# Patient Record
Sex: Female | Born: 1975
Health system: Southern US, Community
[De-identification: ages and names within clinical notes are randomized; demographics above are authoritative.]

## PROBLEM LIST (undated history)

## (undated) ENCOUNTER — Inpatient Hospital Stay (HOSPITAL_COMMUNITY): Payer: Self-pay

## (undated) DIAGNOSIS — R112 Nausea with vomiting, unspecified: Secondary | ICD-10-CM

## (undated) DIAGNOSIS — F32A Depression, unspecified: Secondary | ICD-10-CM

## (undated) DIAGNOSIS — Z9889 Other specified postprocedural states: Secondary | ICD-10-CM

## (undated) DIAGNOSIS — F329 Major depressive disorder, single episode, unspecified: Secondary | ICD-10-CM

## (undated) DIAGNOSIS — Z8489 Family history of other specified conditions: Secondary | ICD-10-CM

## (undated) DIAGNOSIS — Z789 Other specified health status: Secondary | ICD-10-CM

## (undated) HISTORY — PX: ECTOPIC PREGNANCY SURGERY: SHX613

## (undated) HISTORY — PX: DILATION AND CURETTAGE OF UTERUS: SHX78

## (undated) HISTORY — PX: BREAST ENHANCEMENT SURGERY: SHX7

## (undated) HISTORY — PX: OTHER SURGICAL HISTORY: SHX169

---

## 2003-11-03 ENCOUNTER — Other Ambulatory Visit: Admission: RE | Admit: 2003-11-03 | Discharge: 2003-11-03 | Payer: Self-pay | Admitting: Obstetrics and Gynecology

## 2004-11-19 ENCOUNTER — Other Ambulatory Visit: Admission: RE | Admit: 2004-11-19 | Discharge: 2004-11-19 | Payer: Self-pay | Admitting: Obstetrics and Gynecology

## 2006-01-27 ENCOUNTER — Other Ambulatory Visit: Admission: RE | Admit: 2006-01-27 | Discharge: 2006-01-27 | Payer: Self-pay | Admitting: Obstetrics and Gynecology

## 2007-04-29 ENCOUNTER — Emergency Department (HOSPITAL_COMMUNITY): Admission: EM | Admit: 2007-04-29 | Discharge: 2007-04-29 | Payer: Self-pay | Admitting: Family Medicine

## 2007-05-01 ENCOUNTER — Emergency Department (HOSPITAL_COMMUNITY): Admission: EM | Admit: 2007-05-01 | Discharge: 2007-05-01 | Payer: Self-pay | Admitting: Family Medicine

## 2007-05-04 ENCOUNTER — Ambulatory Visit (HOSPITAL_COMMUNITY): Admission: RE | Admit: 2007-05-04 | Discharge: 2007-05-04 | Payer: Self-pay | Admitting: Family Medicine

## 2007-05-06 ENCOUNTER — Other Ambulatory Visit: Admission: RE | Admit: 2007-05-06 | Discharge: 2007-05-06 | Payer: Self-pay | Admitting: Otolaryngology

## 2007-05-18 ENCOUNTER — Ambulatory Visit (HOSPITAL_COMMUNITY): Admission: RE | Admit: 2007-05-18 | Discharge: 2007-05-18 | Payer: Self-pay | Admitting: Orthopedic Surgery

## 2007-05-22 ENCOUNTER — Ambulatory Visit (HOSPITAL_COMMUNITY): Admission: RE | Admit: 2007-05-22 | Discharge: 2007-05-22 | Payer: Self-pay | Admitting: Orthopedic Surgery

## 2007-08-11 ENCOUNTER — Ambulatory Visit (HOSPITAL_COMMUNITY): Admission: RE | Admit: 2007-08-11 | Discharge: 2007-08-11 | Payer: Self-pay | Admitting: Otolaryngology

## 2008-01-03 ENCOUNTER — Emergency Department (HOSPITAL_COMMUNITY): Admission: EM | Admit: 2008-01-03 | Discharge: 2008-01-03 | Payer: Self-pay | Admitting: Emergency Medicine

## 2009-02-22 ENCOUNTER — Ambulatory Visit (HOSPITAL_COMMUNITY): Admission: RE | Admit: 2009-02-22 | Discharge: 2009-02-22 | Payer: Self-pay | Admitting: Otolaryngology

## 2009-11-15 ENCOUNTER — Observation Stay (HOSPITAL_COMMUNITY): Admission: AD | Admit: 2009-11-15 | Discharge: 2009-11-17 | Payer: Self-pay | Admitting: Obstetrics and Gynecology

## 2009-11-25 ENCOUNTER — Inpatient Hospital Stay (HOSPITAL_COMMUNITY): Admission: AD | Admit: 2009-11-25 | Discharge: 2009-12-13 | Payer: Self-pay | Admitting: Obstetrics & Gynecology

## 2010-01-12 DEATH — deceased

## 2010-10-16 ENCOUNTER — Emergency Department (HOSPITAL_COMMUNITY)
Admission: EM | Admit: 2010-10-16 | Discharge: 2010-10-16 | Payer: Self-pay | Source: Home / Self Care | Admitting: Family Medicine

## 2010-12-24 LAB — POCT URINALYSIS DIPSTICK
Glucose, UA: NEGATIVE mg/dL
Specific Gravity, Urine: >=1.03 (ref 1.005–1.030)
pH: 5.5 (ref 5.0–8.0)

## 2010-12-24 LAB — POCT PREGNANCY, URINE: Preg Test, Ur: NEGATIVE

## 2011-01-03 LAB — CBC
HCT: 32.9 % — ABNORMAL LOW (ref 36.0–46.0)
HCT: 34.1 % — ABNORMAL LOW (ref 36.0–46.0)
HCT: 34.7 % — ABNORMAL LOW (ref 36.0–46.0)
Hemoglobin: 11.4 g/dL — ABNORMAL LOW (ref 12.0–15.0)
Hemoglobin: 11.9 g/dL — ABNORMAL LOW (ref 12.0–15.0)
MCHC: 34.3 g/dL (ref 30.0–36.0)
MCHC: 34.4 g/dL (ref 30.0–36.0)
MCV: 89.6 fL (ref 78.0–100.0)
Platelets: 233 K/uL (ref 150–400)
RBC: 3.83 MIL/uL — ABNORMAL LOW (ref 3.87–5.11)
RBC: 3.88 MIL/uL (ref 3.87–5.11)
RDW: 12.8 % (ref 11.5–15.5)
RDW: 13.4 % (ref 11.5–15.5)
WBC: 13.2 K/uL — ABNORMAL HIGH (ref 4.0–10.5)

## 2011-01-03 LAB — STREP B DNA PROBE: Strep Group B Ag: NEGATIVE

## 2011-01-03 LAB — MRSA PCR SCREENING: MRSA by PCR: NEGATIVE

## 2011-01-03 LAB — COMPREHENSIVE METABOLIC PANEL
Alkaline Phosphatase: 102 U/L (ref 39–117)
BUN: 3 mg/dL — ABNORMAL LOW (ref 6–23)
Calcium: 9.1 mg/dL (ref 8.4–10.5)
Chloride: 108 mEq/L (ref 96–112)
GFR calc non Af Amer: >60 mL/min (ref >60)
Glucose, Bld: 88 mg/dL (ref 70–99)
Potassium: 3.5 mEq/L (ref 3.5–5.1)
Total Bilirubin: 0.2 mg/dL — ABNORMAL LOW (ref 0.3–1.2)

## 2011-01-07 LAB — CBC
HCT: 23.2 % — ABNORMAL LOW (ref 36.0–46.0)
Hemoglobin: 7.9 g/dL — ABNORMAL LOW (ref 12.0–15.0)
Hemoglobin: 8.6 g/dL — ABNORMAL LOW (ref 12.0–15.0)
MCHC: 34.9 g/dL (ref 30.0–36.0)
MCV: 89.4 fL (ref 78.0–100.0)
MCV: 89.8 fL (ref 78.0–100.0)
Platelets: 139 10*3/uL — ABNORMAL LOW (ref 150–400)
RBC: 2.76 MIL/uL — ABNORMAL LOW (ref 3.87–5.11)
WBC: 18.3 10*3/uL — ABNORMAL HIGH (ref 4.0–10.5)
WBC: 8.4 10*3/uL (ref 4.0–10.5)

## 2011-07-08 LAB — I-STAT 8, (EC8 V) (CONVERTED LAB)
Acid-base deficit: 3 — ABNORMAL HIGH
Hemoglobin: 14.3
Potassium: 3.7
Sodium: 139
TCO2: 26

## 2011-07-08 LAB — URINALYSIS, ROUTINE W REFLEX MICROSCOPIC
Bilirubin Urine: NEGATIVE
Hgb urine dipstick: NEGATIVE
Ketones, ur: NEGATIVE
Nitrite: NEGATIVE
Urobilinogen, UA: 0.2
pH: 7

## 2011-07-08 LAB — POCT I-STAT CREATININE
Creatinine, Ser: 0.8
Operator id: 284141

## 2011-07-29 LAB — CBC
HCT: 39.7
Hemoglobin: 13.6
MCHC: 34.3
MCV: 89.1
Platelets: 207
RBC: 4.46
RDW: 12.7
WBC: 7.7

## 2011-07-29 LAB — T4, FREE: Free T4: 0.84 — ABNORMAL LOW

## 2011-07-29 LAB — COMPREHENSIVE METABOLIC PANEL
ALT: 18
AST: 17
Albumin: 4.7
Alkaline Phosphatase: 62
BUN: 8
CO2: 24
Calcium: 9.3
Chloride: 102
Creatinine, Ser: 0.69
GFR calc Af Amer: >60
GFR calc non Af Amer: >60
Glucose, Bld: 96
Potassium: 3.6
Sodium: 132 — ABNORMAL LOW
Total Bilirubin: 0.6
Total Protein: 7.7

## 2011-07-29 LAB — TSH: TSH: 1.208

## 2011-12-05 ENCOUNTER — Other Ambulatory Visit (HOSPITAL_COMMUNITY): Payer: Self-pay | Admitting: Internal Medicine

## 2011-12-05 DIAGNOSIS — E041 Nontoxic single thyroid nodule: Secondary | ICD-10-CM

## 2011-12-11 ENCOUNTER — Ambulatory Visit (HOSPITAL_COMMUNITY)
Admission: RE | Admit: 2011-12-11 | Discharge: 2011-12-11 | Disposition: A | Discharge status: Home / Self Care | Payer: 59 | Source: Ambulatory Visit | Attending: Internal Medicine | Admitting: Internal Medicine

## 2011-12-11 DIAGNOSIS — E041 Nontoxic single thyroid nodule: Secondary | ICD-10-CM | POA: Insufficient documentation

## 2011-12-13 ENCOUNTER — Other Ambulatory Visit: Payer: Self-pay | Admitting: Internal Medicine

## 2011-12-13 DIAGNOSIS — E041 Nontoxic single thyroid nodule: Secondary | ICD-10-CM

## 2011-12-19 ENCOUNTER — Ambulatory Visit
Admission: RE | Admit: 2011-12-19 | Discharge: 2011-12-19 | Disposition: A | Discharge status: Home / Self Care | Payer: 59 | Source: Ambulatory Visit | Attending: Internal Medicine | Admitting: Internal Medicine

## 2011-12-19 ENCOUNTER — Other Ambulatory Visit (HOSPITAL_COMMUNITY)
Admission: RE | Admit: 2011-12-19 | Discharge: 2011-12-19 | Disposition: A | Discharge status: Home / Self Care | Payer: 59 | Source: Ambulatory Visit | Attending: Interventional Radiology | Admitting: Interventional Radiology

## 2011-12-19 DIAGNOSIS — E041 Nontoxic single thyroid nodule: Secondary | ICD-10-CM

## 2011-12-19 DIAGNOSIS — E049 Nontoxic goiter, unspecified: Secondary | ICD-10-CM | POA: Insufficient documentation

## 2012-02-16 ENCOUNTER — Inpatient Hospital Stay (HOSPITAL_COMMUNITY): Payer: 59

## 2012-02-16 ENCOUNTER — Encounter (HOSPITAL_COMMUNITY): Payer: Self-pay

## 2012-02-16 ENCOUNTER — Inpatient Hospital Stay (HOSPITAL_COMMUNITY)
Admission: AD | Admit: 2012-02-16 | Discharge: 2012-02-16 | Disposition: A | Discharge status: Home / Self Care | Payer: 59 | Source: Ambulatory Visit | Attending: Obstetrics and Gynecology | Admitting: Obstetrics and Gynecology

## 2012-02-16 DIAGNOSIS — O00109 Unspecified tubal pregnancy without intrauterine pregnancy: Secondary | ICD-10-CM | POA: Insufficient documentation

## 2012-02-16 HISTORY — DX: Depression, unspecified: F32.A

## 2012-02-16 HISTORY — DX: Major depressive disorder, single episode, unspecified: F32.9

## 2012-02-16 LAB — URINE MICROSCOPIC-ADD ON

## 2012-02-16 LAB — DIFFERENTIAL
Basophils Absolute: 0 10*3/uL (ref 0.0–0.1)
Lymphocytes Relative: 17 % (ref 12–46)
Monocytes Absolute: 0.8 10*3/uL (ref 0.1–1.0)
Monocytes Relative: 7 % (ref 3–12)
Neutro Abs: 8.3 10*3/uL — ABNORMAL HIGH (ref 1.7–7.7)

## 2012-02-16 LAB — URINALYSIS, ROUTINE W REFLEX MICROSCOPIC
Glucose, UA: NEGATIVE mg/dL
Ketones, ur: NEGATIVE mg/dL
Leukocytes, UA: NEGATIVE
Nitrite: NEGATIVE
Protein, ur: NEGATIVE mg/dL
Urobilinogen, UA: 0.2 mg/dL (ref 0.0–1.0)

## 2012-02-16 LAB — CBC
HCT: 37.7 % (ref 36.0–46.0)
Hemoglobin: 12.5 g/dL (ref 12.0–15.0)
MCH: 29.4 pg (ref 26.0–34.0)
MCHC: 33.2 g/dL (ref 30.0–36.0)
MCV: 88.7 fL (ref 78.0–100.0)
Platelets: 227 10*3/uL (ref 150–400)
RBC: 4.25 MIL/uL (ref 3.87–5.11)
RDW: 12.9 % (ref 11.5–15.5)
WBC: 11.1 10*3/uL — ABNORMAL HIGH (ref 4.0–10.5)

## 2012-02-16 LAB — ABO/RH: ABO/RH(D): B POS

## 2012-02-16 LAB — CREATININE, SERUM: GFR calc non Af Amer: >90 mL/min (ref >90)

## 2012-02-16 LAB — HCG, QUANTITATIVE, PREGNANCY: hCG, Beta Chain, Quant, S: 1168 m[IU]/mL — ABNORMAL HIGH (ref <5)

## 2012-02-16 MED ORDER — METHOTREXATE INJECTION FOR WOMEN'S HOSPITAL
50.0000 mg/m2 | Freq: Once | INTRAMUSCULAR | Status: AC
  Administered 2012-02-16: 105 mg via INTRAMUSCULAR
  Filled 2012-02-16: qty 2.1

## 2012-02-16 NOTE — Discharge Instructions (Signed)
Ectopic Pregnancy An ectopic pregnancy is when the fertilized egg attaches (implants) outside the uterus. Most ectopic pregnancies occur in the fallopian tube. Rarely do ectopic pregnancies occur on the ovary, intestine, pelvis, or cervix. An ectopic pregnancy does not have the ability to develop into a normal, healthy baby.  A ruptured ectopic pregnancy is one in which the fallopian tube gets torn or bursts and results in internal bleeding. Often there is intense abdominal pain, and sometimes, vaginal bleeding. Having an ectopic pregnancy can be a life-threatening experience. If left untreated, this dangerous condition can lead to a blood transfusion, abdominal surgery, or even death. CAUSES  Damage to the fallopian tubes is the suspected cause in most ectopic pregnancies.  RISK FACTORS Depending on your circumstances, the amount of risk of having an ectopic pregnancy will vary. There are 3 categories that may help you identify whether you are potentially at risk. High Risk  You have gone through infertility treatment.   You have had a previous ectopic pregnancy.   You have had previous tubal surgery.   You have had previous surgery to have the fallopian tubes tied (tubal ligation).   You have tubal problems or diseases.   You have been exposed to DES. DES is a medicine that was used until 1971 and had effects on babies whose mothers took the medicine.   You become pregnant while using an intrauterine device (IUD) for birth control.  Moderate Risk  You have a history of infertility.   You have a history of a sexually transmitted infection (STI).   You have a history of pelvic inflammatory disease (PID).   You have scarring from endometriosis.   You have multiple sexual partners.   You smoke.  Low Risk  You have had previous pelvic surgery.   You use vaginal douching.   You became sexually active before 34 years of age.  SYMPTOMS  An ectopic pregnancy should be  suspected in anyone who has missed a period and has abdominal pain or bleeding.  You may experience normal pregnancy symptoms, such as:   Nausea.   Tiredness.   Breast tenderness.   Symptoms that are not normal include:   Pain with intercourse.   Irregular vaginal bleeding or spotting.   Cramping or pain on one side, or in the lower abdomen.   Fast heartbeat.   Passing out while having a bowel movement.   Symptoms of a ruptured ectopic pregnancy and internal bleeding may include:   Sudden, severe pain in the abdomen and pelvis.   Dizziness or fainting.   Pain in the shoulder area.  DIAGNOSIS  Tests that may be performed include:  A pregnancy test.   An ultrasound.   Testing the specific level of pregnancy hormone in the bloodstream.   Taking a sample of uterus tissue (dilation and curettage, D&C).   Surgery to perform a visual exam of the inside of the abdomen using a lighted tube (laparoscopy).  TREATMENT  An injection of methotrexate medicine may be given. This is given if:  The diagnosis is made early.   The fallopian tube has not ruptured.   You are considered to be a good candidate for the medicine.  Usually, pregnancy hormone blood levels are checked after methotrexate treatment. This is to be sure the medicine is effective. It may take 4 to 6 weeks for the pregnancy to be absorbed (though most pregnancies will be absorbed by 3 weeks). Surgical treatment may be needed. A lighted tube (  laparoscope) is used to remove the tubal pregnancy. If severe internal bleeding occurs, a cut (incision) may be made in the lower abdomen (laparotomy), and the ectopic pregnancy is removed. This stops the bleeding. Part of the fallopian tube, or the whole tube, may be removed as well (salpingectomy). After surgery, pregnancy hormone tests may be done to be sure there is no pregnancy tissue left. You may receive a RhoGAM shot if you are Rh negative and the father is Rh positive, or  if you do not know the Rh type of the father. This is to prevent problems with any future pregnancy. SEEK IMMEDIATE MEDICAL CARE IF:  You have any symptoms of an ectopic pregnancy. This is a medical emergency.   Return to Maternity Admission on on Wednesday 02/19/12 for repeat pregnancy hormone.   Document Released: 11/07/2004 Document Revised: 09/19/2011 Document Reviewed: 06/06/2011 [employer] Patient Information 2012 [employer], LLCMethotrexate Treatment for an Ectopic Pregnancy An ectopic pregnancy is when the fertilized egg attaches (implants) outside the uterus. Most ectopic pregnancies occur in the fallopian tube. Rarely do ectopic pregnancies occur on the ovary, intestine, pelvis, or cervix. An ectopic pregnancy does not have the ability to develop into a normal, healthy baby. Having an ectopic pregnancy can be a life-threatening experience. However, if the ectopic pregnancy is found early enough, it can be treated with a medicine. This medicine is called methotrexate. Methotrexate works by stopping the pregnancy from growing. It helps the body absorb the pregnancy tissue over a 2 to 6 week period (though most pregnancies will be absorbed by 3 weeks).  If methotrexate is successful, there is a good chance that the fallopian tube may be saved. Regardless of whether the fallopian tube is saved, a mother who has had an ectopic pregnancy is at a much higher risk of having another ectopic occur in future pregnancies. One serious concern is the potential for the fallopian tube to tear (rupture). If it does, emergency surgery is needed to remove the pregnancy, and methotrexate cannot be used. The ideal patient for methotrexate is a person who is:   Not bleeding internally.   Has no severe or persistent abdominal pain.   Is committed to following through with lab tests and appointments until the ectopic has absorbed.   Is healthy and has normal liver and kidney functions on evaluation.  Methotrexate  should not be given to women who:  Are breastfeeding.   Have a normal pregnancy (intrauterine pregnancy).   Have liver, lung, or kidney disease.   Have blood problems.   Are allergic to methotrexate.   Have peptic ulcers.   Have an ectopic pregnancy larger than 1 inches (3.5 cm) or one that has fetal heartbeats. This is a rule that is followed most of the time (relative contraindication).  BEFORE THE TREATMENT Before giving the medicine:  Liver tests, kidney tests, and a complete blood test are performed.   Blood tests are performed to measure the pregnancy hormone levels and to determine the mother's blood type.   If the woman is Rh negative, and the father is Rh positive or his Rh type is not known, a RhoGAM shot is given.  TREATMENT  There are 2 methods that your caregiver may use to prescribe methotrexate. One method involves a single dose or injection of the medicine. Another method involves a series of doses. This method involves several injections.  AFTER THE TREATMENT Blood tests will be taken for several weeks to check the pregnancy hormone levels. The blood tests  are performed until there is no more pregnancy hormone detected in the blood. There is still a risk of the ectopic pregnancy rupturing while using the methotrexate. There are also side effects of methotrexate, which include:   Nausea and vomiting.   Mouth sores.   Diarrhea.   Rash.   Dizziness.   Increased abdominal pain.   Increased vaginal bleeding or spotting.   Pneumonia.   Failed treatment.   Hair loss. This is rare and reversible.  On very rare occasions, the medicine may affect your blood counts, liver, kidney, bone marrow, or hormone levels. If this happens, your caregiver will want to perform further evaluations. Document Released: 09/24/2001 Document Revised: 09/19/2011 Document Reviewed: 06/06/2011 [facility] Patient Information 2012 [place], [place].Marland Kitchen

## 2012-02-16 NOTE — MAU Note (Signed)
Patient was given handout on ectopic pregnancy and Methotrexate.

## 2012-02-16 NOTE — MAU Note (Signed)
Dr. McComb in to see patient. 

## 2012-02-16 NOTE — MAU Provider Note (Signed)
History     CSN: [id number]  Arrival date & time 02/16/12  1407   None     Chief Complaint  Patient presents with  . Vaginal Bleeding  . Abdominal Cramping   HPI Catherine Fuller is a 36 y.o. female who presents to MAU for vaginal bleeding in early pregnancy. The bleeding started approximately 2 hours prior to arrival to MAU. Low abdominal cramping and pain in right side earlier but less now. History of tubal reversal 2010. Has been pregnant since then and lost a pregnancy @ 19 weeks due to incompetent cervix. Then a a miscarriage at [redacted] weeks gestation. The history was provided by the patient.  Past Medical History  Diagnosis Date  . Depression     Past Surgical History  Procedure Date  . Tubal reversal   . Breast enhancement surgery   . Cesarean section   . Dilation and curettage of uterus     No family history on file.  History  Substance Use Topics  . Smoking status: Never Smoker   . Smokeless tobacco: Not on file  . Alcohol Use: No    OB History    Grav Para Term Preterm Abortions TAB SAB Ect Mult Living   5 2   2  2   2       Review of Systems  Constitutional: Negative for fever, chills, diaphoresis and fatigue.  HENT: Negative for ear pain, congestion, sore throat, facial swelling, neck pain, neck stiffness, dental problem and sinus pressure.   Eyes: Negative for photophobia, pain and discharge.  Respiratory: Negative for cough, chest tightness and wheezing.   Cardiovascular: Negative for chest pain, palpitations and leg swelling.  Gastrointestinal: Positive for nausea and abdominal pain. Negative for vomiting, diarrhea, constipation and abdominal distention.  Genitourinary: Positive for frequency, vaginal bleeding and pelvic pain. Negative for dysuria, flank pain, vaginal discharge and difficulty urinating.  Musculoskeletal: Positive for back pain. Negative for myalgias and gait problem.  Skin: Negative for color change and rash.  Neurological: Positive for  headaches. Negative for dizziness, speech difficulty, weakness, light-headedness and numbness.  Psychiatric/Behavioral: Negative for confusion and agitation. Nervous/anxious: history of situational depression.     Allergies  Percocet  Home Medications  No current outpatient prescriptions on file.  BP 122/72  Pulse 85  Temp(Src) 98.9 F (37.2 C) (Oral)  Resp 16  Ht 5\' 6"  (1.676 m)  Wt 208 lb (94.348 kg)  BMI 33.57 kg/m2  LMP 01/06/2012  Physical Exam  Nursing note and vitals reviewed. Constitutional: She is oriented to person, place, and time. She appears well-developed and well-nourished. No distress.  HENT:  Head: Normocephalic.  Eyes: EOM are normal.  Neck: Neck supple.  Pulmonary/Chest: Effort normal.  Abdominal: Soft. There is no tenderness.  Genitourinary:       External genitalia without lesions. Small blood vaginal vault. Cervix closed, long, no CMT, no adnexal tenderness  Musculoskeletal: Normal range of motion.  Neurological: She is alert and oriented to person, place, and time. No cranial nerve deficit.  Skin: Skin is warm and dry.  Psychiatric: Her behavior is normal. Judgment and thought content normal. Her mood appears anxious.   Results for orders placed during the hospital encounter of 02/16/12 (from the past 24 hour(s))  URINALYSIS, ROUTINE W REFLEX MICROSCOPIC     Status: Abnormal   Collection Time   02/16/12  2:14 PM      Component Value Range   Color, Urine YELLOW  YELLOW    APPearance  CLEAR  CLEAR    Specific Gravity, Urine 1.015  1.005 - 1.030    pH 6.5  5.0 - 8.0    Glucose, UA NEGATIVE  NEGATIVE (mg/dL)   Hgb urine dipstick MODERATE (*) NEGATIVE    Bilirubin Urine NEGATIVE  NEGATIVE    Ketones, ur NEGATIVE  NEGATIVE (mg/dL)   Protein, ur NEGATIVE  NEGATIVE (mg/dL)   Urobilinogen, UA 0.2  0.0 - 1.0 (mg/dL)   Nitrite NEGATIVE  NEGATIVE    Leukocytes, UA NEGATIVE  NEGATIVE   URINE MICROSCOPIC-ADD ON     Status: Normal   Collection Time   02/16/12   2:14 PM      Component Value Range   WBC, UA 0-2  <3 (WBC/hpf)   RBC / HPF 0-2  <3 (RBC/hpf)   Bacteria, UA RARE  RARE   HCG, QUANTITATIVE, PREGNANCY     Status: Abnormal   Collection Time   02/16/12  2:40 PM      Component Value Range   hCG, Beta Chain, Quant, S 1168 (*) <5 (mIU/mL)  ABO/RH     Status: Normal   Collection Time   02/16/12  2:41 PM      Component Value Range   ABO/RH(D) B POS    CBC     Status: Abnormal   Collection Time   02/16/12  2:41 PM      Component Value Range   WBC 11.1 (*) 4.0 - 10.5 (K/uL)   RBC 4.25  3.87 - 5.11 (MIL/uL)   Hemoglobin 12.5  12.0 - 15.0 (g/dL)   HCT 16.1  09.6 - 04.5 (%)   MCV 88.7  78.0 - 100.0 (fL)   MCH 29.4  26.0 - 34.0 (pg)   MCHC 33.2  30.0 - 36.0 (g/dL)   RDW 40.9  81.1 - 91.4 (%)   Platelets 227  150 - 400 (K/uL)    ED Course  Procedures Ultrasound today shows no IUP normal ovaries, right adnexal mass measuring 5.1 x 2.1 . 1.7 cm  6 mm MSD right adnexal ectopic GS without YS or fetal pole. Small FF.   MDM: consult with Dr. Arelia Sneddon and he will be in to evaluate the patient and discuss treatment options.

## 2012-02-16 NOTE — MAU Provider Note (Signed)
  History     CSN: [id number]  Arrival date and time: 02/16/12 1407   First Provider Initiated Contact with Patient 02/16/12 1445      Chief Complaint  Patient presents with  . Vaginal Bleeding  . Abdominal Cramping   HPI    Past Medical History  Diagnosis Date  . Depression     Past Surgical History  Procedure Date  . Tubal reversal   . Breast enhancement surgery   . Cesarean section   . Dilation and curettage of uterus     No family history on file.  History  Substance Use Topics  . Smoking status: Never Smoker   . Smokeless tobacco: Not on file  . Alcohol Use: No    Allergies:  Allergies  Allergen Reactions  . Percocet (Oxycodone-Acetaminophen) Nausea And Vomiting    Prescriptions prior to admission  Medication Sig Dispense Refill  . clonazePAM (KLONOPIN) 1 MG tablet Take 1 mg by mouth 2 (two) times daily as needed. For anxiety      . FLUoxetine (PROZAC) 20 MG capsule Take 20 mg by mouth at bedtime.      . Prenatal Vit-Fe Fumarate-FA (PRENATAL MULTIVITAMIN) TABS Take 1 tablet by mouth at bedtime.        ROS Physical Exam   Blood pressure 122/72, pulse 85, temperature 98.9 F (37.2 C), temperature source Oral, resp. rate 16, height 5\' 6"  (1.676 m), weight 94.348 kg (208 lb), last menstrual period 01/06/2012.  Physical Exam  MAU Course  Procedures  MDM sono c/w right ectopic   Assessment and Plan  Highly probably ectopic.   Discussed finding.  Options include following vs methotraxate or surgery.  Can not be 100 % that it is a ectopic.  If intrauterine mtx would lead to birth defects and require termination.  80 % chance mtx would be successful. Possible need for surgery or repeat injection.  Patient desires mtx. Will check labs and arrange f/u.  Signs of rupture are given.  If increase pain report to MAU.   Nadiah Corbit S 02/16/2012, 5:04 PM

## 2012-02-16 NOTE — MAU Note (Signed)
Patient presents with vaginal bleeding and cramping on right side onset this morning and had a lot of blood in the toilet pain 6/10, patient had a loss at 19 1/2 weeks about 2 years ago and a miscarriage one year ago, LMP 01/06/12

## 2012-02-16 NOTE — MAU Note (Signed)
Patient was given information on ectopic pregnancy discussed loss with patient allowed her to discuss her feelings gave her resources on grief counseling available in the area.  Patient was told by Dr. Arelia Sneddon to follow up in the office on Wednesday for blood work patient will call MD office tomorrow for appointment reviewed ectopic precautions patient verbalized an understanding.

## 2012-03-13 ENCOUNTER — Ambulatory Visit: Payer: Self-pay | Admitting: Internal Medicine

## 2012-08-05 ENCOUNTER — Encounter (HOSPITAL_COMMUNITY): Payer: Self-pay | Admitting: *Deleted

## 2012-08-05 ENCOUNTER — Inpatient Hospital Stay (HOSPITAL_COMMUNITY)
Admission: AD | Admit: 2012-08-05 | Discharge: 2012-08-05 | Disposition: A | Discharge status: Home / Self Care | Payer: 59 | Source: Ambulatory Visit | Attending: Obstetrics & Gynecology | Admitting: Obstetrics & Gynecology

## 2012-08-05 DIAGNOSIS — O00109 Unspecified tubal pregnancy without intrauterine pregnancy: Secondary | ICD-10-CM | POA: Insufficient documentation

## 2012-08-05 LAB — CREATININE, SERUM
Creatinine, Ser: 0.59 mg/dL (ref 0.50–1.10)
GFR calc Af Amer: >90 mL/min (ref >90)
GFR calc non Af Amer: >90 mL/min (ref >90)

## 2012-08-05 LAB — CBC WITH DIFFERENTIAL/PLATELET
Eosinophils Absolute: 0 10*3/uL (ref 0.0–0.7)
Hemoglobin: 12.4 g/dL (ref 12.0–15.0)
Lymphs Abs: 1.8 10*3/uL (ref 0.7–4.0)
MCH: 30 pg (ref 26.0–34.0)
Monocytes Relative: 7 % (ref 3–12)
Neutrophils Relative %: 77 % (ref 43–77)
RBC: 4.14 MIL/uL (ref 3.87–5.11)

## 2012-08-05 LAB — BUN: BUN: 10 mg/dL (ref 6–23)

## 2012-08-05 LAB — TYPE AND SCREEN: ABO/RH(D): B POS

## 2012-08-05 MED ORDER — RHO D IMMUNE GLOBULIN 1500 UNIT/2ML IJ SOLN
300.0000 ug | Freq: Once | INTRAMUSCULAR | Status: DC
  Filled 2012-08-05: qty 2

## 2012-08-05 MED ORDER — METHOTREXATE INJECTION FOR WOMEN'S HOSPITAL
50.0000 mg/m2 | Freq: Once | INTRAMUSCULAR | Status: AC
  Administered 2012-08-05: 100 mg via INTRAMUSCULAR
  Filled 2012-08-05: qty 2

## 2012-08-05 NOTE — H&P (Signed)
36 year old presents with left ectopic pregnancy. Denies pain or bleeding, Ultrasound today no IUP and 2.3 cm left adnexal mass. No free fluid Quant 2224 History of ectopic pregnancy successfully treated with methotrexate in April. History of tubal reversal and endometrial ablation.  Afebrile Vital signs stable General alert and oriented Lung CTAB Car RRR Abdomen is soft and non tender  IMPRESSION: Left ectopic pregnancy  PLAN: Methotrexate

## 2012-08-05 NOTE — MAU Note (Signed)
No adverse reaction to MTX 

## 2012-08-05 NOTE — MAU Note (Signed)
Pt denies pain or bleeding. Voices understanding to return to Dr. Lynnell Dike office for f/u bhcg level on Monday. Appt made already in office today.

## 2012-08-05 NOTE — MAU Note (Signed)
Pt sent from Dr. Lynnell Dike office for methotrexate.  Dx'd with ectopic pregnancy today.

## 2012-08-10 ENCOUNTER — Inpatient Hospital Stay (HOSPITAL_COMMUNITY)
Admission: AD | Admit: 2012-08-10 | Discharge: 2012-08-10 | Disposition: A | Discharge status: Home / Self Care | Payer: 59 | Source: Ambulatory Visit | Attending: Obstetrics and Gynecology | Admitting: Obstetrics and Gynecology

## 2012-08-10 DIAGNOSIS — O00109 Unspecified tubal pregnancy without intrauterine pregnancy: Secondary | ICD-10-CM | POA: Insufficient documentation

## 2012-08-10 LAB — CBC WITH DIFFERENTIAL/PLATELET
Basophils Absolute: 0 10*3/uL (ref 0.0–0.1)
Basophils Relative: 0 % (ref 0–1)
Eosinophils Absolute: 0.1 10*3/uL (ref 0.0–0.7)
Eosinophils Relative: 2 % (ref 0–5)
MCH: 29.8 pg (ref 26.0–34.0)
MCHC: 33.9 g/dL (ref 30.0–36.0)
MCV: 87.8 fL (ref 78.0–100.0)
Platelets: 192 10*3/uL (ref 150–400)
RDW: 12.8 % (ref 11.5–15.5)
WBC: 6.6 10*3/uL (ref 4.0–10.5)

## 2012-08-10 LAB — COMPREHENSIVE METABOLIC PANEL
ALT: 12 U/L (ref 0–35)
AST: 14 U/L (ref 0–37)
Calcium: 9 mg/dL (ref 8.4–10.5)
Creatinine, Ser: 0.55 mg/dL (ref 0.50–1.10)
GFR calc Af Amer: >90 mL/min (ref >90)
Sodium: 136 mEq/L (ref 135–145)
Total Protein: 6.9 g/dL (ref 6.0–8.3)

## 2012-08-10 MED ORDER — METHOTREXATE INJECTION FOR WOMEN'S HOSPITAL
50.0000 mg/m2 | Freq: Once | INTRAMUSCULAR | Status: AC
  Administered 2012-08-10: 105 mg via INTRAMUSCULAR
  Filled 2012-08-10: qty 2.1

## 2012-08-10 NOTE — MAU Note (Signed)
Patient states she was seen in MAU on 10-23 and given MTX for an ectopic on the left side. Patient states she was called today and told the BHCG was over 9000 and told to return for a repeat injection. Patient denies any bleeding but does have some pain on the left side and has nausea all the time. Patient is concerned about a rupture of the ectopic.

## 2012-08-10 NOTE — MAU Note (Signed)
Methotrexate injection administered @ 1901 without any complications.  Pt left MAU 15 min later and will return to office this Thursday the 31st.

## 2012-08-26 ENCOUNTER — Other Ambulatory Visit: Payer: Self-pay | Admitting: Dermatology

## 2012-10-26 ENCOUNTER — Emergency Department (HOSPITAL_COMMUNITY)
Admission: EM | Admit: 2012-10-26 | Discharge: 2012-10-26 | Disposition: A | Discharge status: Home / Self Care | Payer: PRIVATE HEALTH INSURANCE | Attending: Emergency Medicine | Admitting: Emergency Medicine

## 2012-10-26 ENCOUNTER — Encounter (HOSPITAL_COMMUNITY): Payer: Self-pay | Admitting: *Deleted

## 2012-10-26 DIAGNOSIS — Y9389 Activity, other specified: Secondary | ICD-10-CM | POA: Insufficient documentation

## 2012-10-26 DIAGNOSIS — F329 Major depressive disorder, single episode, unspecified: Secondary | ICD-10-CM | POA: Insufficient documentation

## 2012-10-26 DIAGNOSIS — Z79899 Other long term (current) drug therapy: Secondary | ICD-10-CM | POA: Insufficient documentation

## 2012-10-26 DIAGNOSIS — Y9289 Other specified places as the place of occurrence of the external cause: Secondary | ICD-10-CM | POA: Insufficient documentation

## 2012-10-26 DIAGNOSIS — W1809XA Striking against other object with subsequent fall, initial encounter: Secondary | ICD-10-CM | POA: Insufficient documentation

## 2012-10-26 DIAGNOSIS — F3289 Other specified depressive episodes: Secondary | ICD-10-CM | POA: Insufficient documentation

## 2012-10-26 DIAGNOSIS — S60229A Contusion of unspecified hand, initial encounter: Secondary | ICD-10-CM | POA: Insufficient documentation

## 2012-10-26 DIAGNOSIS — T148XXA Other injury of unspecified body region, initial encounter: Secondary | ICD-10-CM

## 2012-10-26 MED ORDER — IBUPROFEN 400 MG PO TABS
600.0000 mg | ORAL_TABLET | Freq: Once | ORAL | Status: AC
  Administered 2012-10-26: 600 mg via ORAL
  Filled 2012-10-26: qty 2

## 2012-10-26 NOTE — ED Provider Notes (Signed)
History     CSN: [id number]  Arrival date & time 10/26/12  0405   First MD Initiated Contact with Patient 10/26/12 0410      Chief Complaint  Patient presents with  . other     (Consider location/radiation/quality/duration/timing/severity/associated sxs/prior treatment) HPI Hx per PT. At work tonight sitting in her office when a cabinet attached to the wall, fell on her. She tried to stop it her her hand, sustained bruise to L 3rd digit and the cabinet struck her in the upper back. No neck pain, no head injury, no LOC, no CP or SOB.  Pain sharp, mild in severity. No trouble moving arms and hands or fingers. No other injury. Past Medical History  Diagnosis Date  . Depression     Past Surgical History  Procedure Date  . Tubal reversal   . Breast enhancement surgery   . Cesarean section   . Dilation and curettage of uterus   . Ectopic pregnancy surgery     Family History  Problem Relation Age of Onset  . Other Neg Hx     History  Substance Use Topics  . Smoking status: Never Smoker   . Smokeless tobacco: Never Used  . Alcohol Use: No    OB History    Grav Para Term Preterm Abortions TAB SAB Ect Mult Living   6 2   3  2 1  2       Review of Systems  Constitutional: Negative for fever and chills.  HENT: Negative for neck pain and neck stiffness.   Eyes: Negative for pain.  Respiratory: Negative for shortness of breath.   Cardiovascular: Negative for chest pain.  Gastrointestinal: Negative for abdominal pain.  Genitourinary: Negative for dysuria.  Musculoskeletal: Positive for back pain.  Skin: Negative for rash.  Neurological: Negative for headaches.  All other systems reviewed and are negative.    Allergies  Percocet  Home Medications   Current Outpatient Rx  Name  Route  Sig  Dispense  Refill  . CLONAZEPAM 1 MG PO TABS   Oral   Take 1 mg by mouth 2 (two) times daily as needed. For anxiety         . FLUOXETINE HCL 20 MG PO CAPS   Oral   Take  20 mg by mouth at bedtime.         Marland Kitchen PRENATAL MULTIVITAMIN CH   Oral   Take 1 tablet by mouth at bedtime.           BP 153/80  Pulse 82  Temp 98.8 F (37.1 C) (Oral)  Resp 16  Ht 5\' 6"  (1.676 m)  Wt 198 lb (89.812 kg)  BMI 31.96 kg/m2  SpO2 100%  LMP 10/05/2012  Breastfeeding? Unknown  Physical Exam  Constitutional: She is oriented to person, place, and time. She appears well-developed and well-nourished.  HENT:  Head: Normocephalic and atraumatic.  Eyes: EOM are normal. Pupils are equal, round, and reactive to light.  Neck: Neck supple.       No C spine tenderness  Cardiovascular: Normal rate, regular rhythm and intact distal pulses.   Pulmonary/Chest: Effort normal and breath sounds normal. No respiratory distress. She exhibits no tenderness.  Abdominal: Soft. Bowel sounds are normal. There is no tenderness.  Musculoskeletal: Normal range of motion. She exhibits no edema.       Localizes discomfort parathoracic without any soft tissue or bony tenderness, mild ecchymosis and swelling to L 3rd digit in area of PIP with  FROM, no deficits and distal N/V intact.   Neurological: She is alert and oriented to person, place, and time.  Skin: Skin is warm and dry.    ED Course  Procedures (including critical care time)  Motrin provided, PT agrees that she does need xrays at this time. Plan close outpatient follow up as needed. Contusion instruction/ precautions provided. PT wishes to return to work and is able to do so at this time.  1. Contusion       MDM  VS and nursing notes reviewed. Medication provided as above.        Sunnie Nielsen, MD 10/26/12 [phone]

## 2012-10-26 NOTE — ED Notes (Signed)
Patient here from her department  While sitting in a chair in her department, a large cabinet fell off the wall hitting her in the back and left hand.  Red makes on back, left index, middle and ring finger red and painful.  Able to move all ext

## 2012-10-26 NOTE — ED Notes (Signed)
Discharge inst reviewed and voiced understanding

## 2012-11-04 ENCOUNTER — Inpatient Hospital Stay (HOSPITAL_COMMUNITY)
Admission: AD | Admit: 2012-11-04 | Discharge: 2012-11-04 | Disposition: A | Discharge status: Home / Self Care | Payer: 59 | Source: Ambulatory Visit | Attending: Obstetrics and Gynecology | Admitting: Obstetrics and Gynecology

## 2012-11-04 DIAGNOSIS — O00109 Unspecified tubal pregnancy without intrauterine pregnancy: Secondary | ICD-10-CM | POA: Insufficient documentation

## 2012-11-04 LAB — CBC WITH DIFFERENTIAL/PLATELET
Eosinophils Absolute: 0.1 10*3/uL (ref 0.0–0.7)
Hemoglobin: 13.1 g/dL (ref 12.0–15.0)
Lymphocytes Relative: 42 % (ref 12–46)
Lymphs Abs: 2.4 10*3/uL (ref 0.7–4.0)
MCH: 30.4 pg (ref 26.0–34.0)
MCV: 89.1 fL (ref 78.0–100.0)
Monocytes Relative: 10 % (ref 3–12)
Neutrophils Relative %: 47 % (ref 43–77)
RBC: 4.31 MIL/uL (ref 3.87–5.11)
WBC: 5.8 10*3/uL (ref 4.0–10.5)

## 2012-11-04 LAB — BUN: BUN: 8 mg/dL (ref 6–23)

## 2012-11-04 LAB — CREATININE, SERUM
GFR calc Af Amer: >90 mL/min (ref >90)
GFR calc non Af Amer: >90 mL/min (ref >90)

## 2012-11-04 LAB — AST: AST: 15 U/L (ref 0–37)

## 2012-11-04 MED ORDER — METHOTREXATE INJECTION FOR WOMEN'S HOSPITAL
50.0000 mg/m2 | Freq: Once | INTRAMUSCULAR | Status: AC
  Administered 2012-11-04: 105 mg via INTRAMUSCULAR
  Filled 2012-11-04: qty 2.1

## 2012-11-04 NOTE — MAU Note (Signed)
Dr. Lynnell Dike office notified of patient arrival to MAU, orders requested.

## 2012-11-04 NOTE — MAU Note (Signed)
Patient states she was sent from the office for a MTX injections. Patient states she had a MTX injection in October but continues to have BHCG levels above non pregnant. Denies any pain or bleeding other than she started her period on 1-21.

## 2012-11-04 NOTE — MAU Note (Signed)
Dr. Lynnell Dike office notified of lab results.

## 2012-11-04 NOTE — H&P (Signed)
37 year old female well known to me who had an ectopic pregnancy in October treated with Methotrexate x 2. Her HCG levels initially went down but now they have remained at 18.8. She has not had pain or bleeding.  I recommend 3rd methotrexate injection today. Patient agrees. Labs per protocol

## 2012-12-11 ENCOUNTER — Encounter (HOSPITAL_COMMUNITY): Payer: Self-pay | Admitting: Pharmacist

## 2012-12-11 ENCOUNTER — Encounter (HOSPITAL_COMMUNITY): Payer: Self-pay | Admitting: *Deleted

## 2012-12-14 ENCOUNTER — Encounter (HOSPITAL_COMMUNITY): Payer: Self-pay | Admitting: *Deleted

## 2012-12-14 ENCOUNTER — Encounter (HOSPITAL_COMMUNITY): Payer: Self-pay | Admitting: Anesthesiology

## 2012-12-14 ENCOUNTER — Ambulatory Visit (HOSPITAL_COMMUNITY): Payer: 59 | Admitting: Anesthesiology

## 2012-12-14 ENCOUNTER — Encounter (HOSPITAL_COMMUNITY)
Admission: RE | Disposition: A | Discharge status: Home / Self Care | Payer: Self-pay | Source: Ambulatory Visit | Attending: Obstetrics and Gynecology

## 2012-12-14 ENCOUNTER — Ambulatory Visit (HOSPITAL_COMMUNITY)
Admission: RE | Admit: 2012-12-14 | Discharge: 2012-12-14 | Disposition: A | Discharge status: Home / Self Care | Payer: 59 | Source: Ambulatory Visit | Attending: Obstetrics and Gynecology | Admitting: Obstetrics and Gynecology

## 2012-12-14 DIAGNOSIS — O009 Unspecified ectopic pregnancy without intrauterine pregnancy: Secondary | ICD-10-CM

## 2012-12-14 DIAGNOSIS — O00109 Unspecified tubal pregnancy without intrauterine pregnancy: Secondary | ICD-10-CM | POA: Insufficient documentation

## 2012-12-14 HISTORY — DX: Other specified health status: Z78.9

## 2012-12-14 HISTORY — DX: Nausea with vomiting, unspecified: R11.2

## 2012-12-14 HISTORY — PX: BILATERAL SALPINGECTOMY: SHX5743

## 2012-12-14 HISTORY — DX: Other specified postprocedural states: Z98.890

## 2012-12-14 HISTORY — PX: LAPAROSCOPY: SHX197

## 2012-12-14 HISTORY — DX: Family history of other specified conditions: Z84.89

## 2012-12-14 LAB — CBC
HCT: 36.8 % (ref 36.0–46.0)
MCH: 29.9 pg (ref 26.0–34.0)
MCV: 87.2 fL (ref 78.0–100.0)
Platelets: 192 10*3/uL (ref 150–400)
RBC: 4.22 MIL/uL (ref 3.87–5.11)

## 2012-12-14 SURGERY — LAPAROSCOPY OPERATIVE
Anesthesia: General | Laterality: Bilateral | Wound class: Clean Contaminated

## 2012-12-14 MED ORDER — FENTANYL CITRATE 0.05 MG/ML IJ SOLN
25.0000 ug | INTRAMUSCULAR | Status: DC | PRN
  Administered 2012-12-14 (×2): 50 ug via INTRAVENOUS

## 2012-12-14 MED ORDER — HYDROCODONE-ACETAMINOPHEN 5-325 MG PO TABS: 1.0000 | ORAL_TABLET | ORAL | Status: AC

## 2012-12-14 MED ORDER — FENTANYL CITRATE 0.05 MG/ML IJ SOLN
INTRAMUSCULAR | Status: AC
  Administered 2012-12-14: 50 ug via INTRAVENOUS
  Filled 2012-12-14: qty 2

## 2012-12-14 MED ORDER — MIDAZOLAM HCL 2 MG/2ML IJ SOLN
INTRAMUSCULAR | Status: AC
  Filled 2012-12-14: qty 2

## 2012-12-14 MED ORDER — KETOROLAC TROMETHAMINE 30 MG/ML IJ SOLN
INTRAMUSCULAR | Status: DC | PRN
  Administered 2012-12-14: 30 mg via INTRAVENOUS

## 2012-12-14 MED ORDER — NEOSTIGMINE METHYLSULFATE 1 MG/ML IJ SOLN
INTRAMUSCULAR | Status: AC
  Filled 2012-12-14: qty 1

## 2012-12-14 MED ORDER — MUPIROCIN 2 % EX OINT
TOPICAL_OINTMENT | CUTANEOUS | Status: AC
  Filled 2012-12-14: qty 22

## 2012-12-14 MED ORDER — HYDROCODONE-ACETAMINOPHEN 5-325 MG PO TABS
ORAL_TABLET | ORAL | Status: AC
  Administered 2012-12-14: 1 via ORAL
  Filled 2012-12-14: qty 1

## 2012-12-14 MED ORDER — PROPOFOL 10 MG/ML IV EMUL
INTRAVENOUS | Status: DC | PRN
  Administered 2012-12-14: 200 mg via INTRAVENOUS

## 2012-12-14 MED ORDER — CEFAZOLIN SODIUM-DEXTROSE 2-3 GM-% IV SOLR
2.0000 g | INTRAVENOUS | Status: AC
  Administered 2012-12-14: 2 g via INTRAVENOUS

## 2012-12-14 MED ORDER — HYDROCODONE-ACETAMINOPHEN 5-325 MG PO TABS
ORAL_TABLET | ORAL | Status: AC
  Filled 2012-12-14: qty 1

## 2012-12-14 MED ORDER — HYDROCODONE-ACETAMINOPHEN 5-500 MG PO TABS: 1.0000 | ORAL_TABLET | Freq: Four times a day (QID) | ORAL | Status: AC | PRN

## 2012-12-14 MED ORDER — ROCURONIUM BROMIDE 100 MG/10ML IV SOLN: INTRAVENOUS | Status: DC | PRN

## 2012-12-14 MED ORDER — KETOROLAC TROMETHAMINE 30 MG/ML IJ SOLN
INTRAMUSCULAR | Status: AC
  Filled 2012-12-14: qty 1

## 2012-12-14 MED ORDER — EPHEDRINE 5 MG/ML INJ
INTRAVENOUS | Status: AC
  Filled 2012-12-14: qty 10

## 2012-12-14 MED ORDER — ROCURONIUM BROMIDE 100 MG/10ML IV SOLN
INTRAVENOUS | Status: DC | PRN
  Administered 2012-12-14: 40 mg via INTRAVENOUS

## 2012-12-14 MED ORDER — DEXAMETHASONE SODIUM PHOSPHATE 10 MG/ML IJ SOLN
INTRAMUSCULAR | Status: AC
  Filled 2012-12-14: qty 1

## 2012-12-14 MED ORDER — MUPIROCIN 2 % EX OINT: TOPICAL_OINTMENT | Freq: Two times a day (BID) | CUTANEOUS | Status: DC

## 2012-12-14 MED ORDER — METOCLOPRAMIDE HCL 5 MG/ML IJ SOLN: 10.0000 mg | Freq: Once | INTRAMUSCULAR | Status: DC | PRN

## 2012-12-14 MED ORDER — MIDAZOLAM HCL 5 MG/5ML IJ SOLN
INTRAMUSCULAR | Status: DC | PRN
  Administered 2012-12-14: 2 mg via INTRAVENOUS

## 2012-12-14 MED ORDER — GLYCOPYRROLATE 0.2 MG/ML IJ SOLN
INTRAMUSCULAR | Status: DC | PRN
  Administered 2012-12-14: 0.2 mg via INTRAVENOUS

## 2012-12-14 MED ORDER — GLYCOPYRROLATE 0.2 MG/ML IJ SOLN
INTRAMUSCULAR | Status: AC
  Filled 2012-12-14: qty 2

## 2012-12-14 MED ORDER — FENTANYL CITRATE 0.05 MG/ML IJ SOLN
INTRAMUSCULAR | Status: AC
  Filled 2012-12-14: qty 2

## 2012-12-14 MED ORDER — LACTATED RINGERS IV SOLN
INTRAVENOUS | Status: DC
  Administered 2012-12-14: 07:00:00 via INTRAVENOUS

## 2012-12-14 MED ORDER — PROPOFOL 10 MG/ML IV EMUL
INTRAVENOUS | Status: AC
  Filled 2012-12-14: qty 20

## 2012-12-14 MED ORDER — CEFAZOLIN SODIUM-DEXTROSE 2-3 GM-% IV SOLR
INTRAVENOUS | Status: AC
  Filled 2012-12-14: qty 50

## 2012-12-14 MED ORDER — LIDOCAINE HCL (CARDIAC) 20 MG/ML IV SOLN
INTRAVENOUS | Status: DC | PRN
  Administered 2012-12-14: 100 mg via INTRAVENOUS

## 2012-12-14 MED ORDER — SCOPOLAMINE 1 MG/3DAYS TD PT72: 1.0000 | MEDICATED_PATCH | TRANSDERMAL | Status: DC

## 2012-12-14 MED ORDER — SCOPOLAMINE 1 MG/3DAYS TD PT72
MEDICATED_PATCH | TRANSDERMAL | Status: AC
  Administered 2012-12-14: 1.5 mg via TRANSDERMAL
  Filled 2012-12-14: qty 1

## 2012-12-14 MED ORDER — EPHEDRINE SULFATE 50 MG/ML IJ SOLN
INTRAMUSCULAR | Status: DC | PRN
  Administered 2012-12-14: 10 mg via INTRAVENOUS

## 2012-12-14 MED ORDER — FENTANYL CITRATE 0.05 MG/ML IJ SOLN
INTRAMUSCULAR | Status: AC
  Filled 2012-12-14: qty 5

## 2012-12-14 MED ORDER — FENTANYL CITRATE 0.05 MG/ML IJ SOLN
INTRAMUSCULAR | Status: DC | PRN
  Administered 2012-12-14: 100 ug via INTRAVENOUS
  Administered 2012-12-14 (×2): 50 ug via INTRAVENOUS

## 2012-12-14 MED ORDER — ONDANSETRON HCL 4 MG/2ML IJ SOLN
INTRAMUSCULAR | Status: AC
  Filled 2012-12-14: qty 2

## 2012-12-14 MED ORDER — LIDOCAINE HCL (CARDIAC) 20 MG/ML IV SOLN
INTRAVENOUS | Status: AC
  Filled 2012-12-14: qty 5

## 2012-12-14 MED ORDER — BUPIVACAINE HCL (PF) 0.25 % IJ SOLN
INTRAMUSCULAR | Status: DC | PRN
  Administered 2012-12-14: 6 mL

## 2012-12-14 MED ORDER — LACTATED RINGERS IV SOLN
INTRAVENOUS | Status: DC
  Administered 2012-12-14: 125 mL/h via INTRAVENOUS

## 2012-12-14 SURGICAL SUPPLY — 28 items
ADH SKN CLS LQ APL DERMABOND (GAUZE/BANDAGES/DRESSINGS) ×1
BAG SPEC RTRVL LRG 6X4 10 (ENDOMECHANICALS)
CABLE HIGH FREQUENCY MONO STRZ (ELECTRODE) IMPLANT
CATH ROBINSON RED A/P 16FR (CATHETERS) ×2 IMPLANT
CHLORAPREP W/TINT 26ML (MISCELLANEOUS) ×2 IMPLANT
CLOTH BEACON ORANGE TIMEOUT ST (SAFETY) ×2 IMPLANT
DERMABOND ADHESIVE PROPEN (GAUZE/BANDAGES/DRESSINGS) ×1
DERMABOND ADVANCED .7 DNX6 (GAUZE/BANDAGES/DRESSINGS) IMPLANT
EVACUATOR SMOKE 8.L (FILTER) IMPLANT
GLOVE BIO SURGEON STRL SZ 6.5 (GLOVE) ×2 IMPLANT
GOWN PREVENTION PLUS LG XLONG (DISPOSABLE) ×4 IMPLANT
NEEDLE INSUFFLATION 120MM (ENDOMECHANICALS) ×2 IMPLANT
NS IRRIG 1000ML POUR BTL (IV SOLUTION) ×1 IMPLANT
PACK LAPAROSCOPY BASIN (CUSTOM PROCEDURE TRAY) ×2 IMPLANT
POUCH SPECIMEN RETRIEVAL 10MM (ENDOMECHANICALS) IMPLANT
PROTECTOR NERVE ULNAR (MISCELLANEOUS) ×2 IMPLANT
SEALER TISSUE G2 CVD JAW 35 (ENDOMECHANICALS) IMPLANT
SEALER TISSUE G2 CVD JAW 45CM (ENDOMECHANICALS) IMPLANT
SET IRRIG TUBING LAPAROSCOPIC (IRRIGATION / IRRIGATOR) IMPLANT
SUT VIC AB 3-0 PS2 18 (SUTURE) ×4
SUT VIC AB 3-0 PS2 18XBRD (SUTURE) ×1 IMPLANT
SUT VICRYL 0 UR6 27IN ABS (SUTURE) ×3 IMPLANT
TOWEL OR 17X24 6PK STRL BLUE (TOWEL DISPOSABLE) ×4 IMPLANT
TROCAR BALLN GELPORT 12X130M (ENDOMECHANICALS) ×1 IMPLANT
TROCAR OPTI TIP 5M 100M (ENDOMECHANICALS) ×1 IMPLANT
TROCAR XCEL DIL TIP R 11M (ENDOMECHANICALS) ×3 IMPLANT
WARMER LAPAROSCOPE (MISCELLANEOUS) ×2 IMPLANT
WATER STERILE IRR 1000ML POUR (IV SOLUTION) ×2 IMPLANT

## 2012-12-14 NOTE — H&P (Signed)
37 year old G 6 P 20032 presents for management of chronic ectopic pregnancy. Patient has had 2 C Sections and BTL. She then underwent endometrial ablation. After that, she had a tubal reanastomosis. She conceived 3 times after that.  The first time she delivered pre viable at 19 weeks.  Last year, she had 2 ectopic pregnancies. The first one responded well to methotrexate. The second one, however, was treated with 3 total injections and her quantitative HCG has remained in the 10 range. She is without pain or bleeding. She and her husband have decided to no longer attempt pregnancy. She wants permanent sterilization.  I have recommended bilateral salpingectomy because I am concerned that she may fail Tubal ligation because of above history.  She has been counseled that she may have adhesions and I may have to perform a laparotomy.  Allergic to Percocet   Surgical history - above Medical history - unremarkable  Afebrile VSS General alert and oriented Lung CTAB Car RRR Abdomen is benign Pelvic is unremarkable  IMPRESSION: Chronic Ectopic Pregnancy Desires sterilization  PLAN: Open Laparoscopy Bilateral salpingectomy Possible laparotomy Consent is signed.

## 2012-12-14 NOTE — Anesthesia Postprocedure Evaluation (Signed)
  Anesthesia Post-op Note  Patient: Catherine Fuller  Procedure(s) Performed: Procedure(s):  Open laparoscopy Bilateral salpingectomy (Bilateral) Bilateral SALPINGECTOMY (Bilateral)  Patient Location: PACU  Anesthesia Type:General  Level of Consciousness: awake, alert  and oriented  Airway and Oxygen Therapy: Patient Spontanous Breathing  Post-op Pain: mild  Post-op Assessment: Post-op Vital signs reviewed, Patient's Cardiovascular Status Stable, Respiratory Function Stable, Patent Airway, No signs of Nausea or vomiting and Pain level controlled  Post-op Vital Signs: Reviewed and stable  Complications: No apparent anesthesia complications

## 2012-12-14 NOTE — Op Note (Signed)
[name], Catherine Fuller               ACCOUNT NO.:  [id number]  MEDICAL RECORD NO.:  [id number]  LOCATION:  [facility]                          FACILITY:  [facility]  PHYSICIAN:  Michelle L. Grewal, M.D.DATE OF BIRTH:  1976/03/09  DATE OF PROCEDURE:  12/14/2012 DATE OF DISCHARGE:                              OPERATIVE REPORT   PREOPERATIVE DIAGNOSIS:  Chronic left ectopic pregnancy and desires permanent sterilization.  POSTOPERATIVE DIAGNOSIS:  Chronic left ectopic pregnancy and desires permanent sterilization.  PROCEDURE:  Open laparoscopy and bilateral salpingectomies.  SURGEON:  Michelle L. Vincente Poli, MD  ANESTHESIA:  General.  EBL:  Minimal.  COMPLICATIONS:  None.  DRAINS:  Foley catheter removed at the end of the case.  PATHOLOGY:  Bilateral fallopian tube segments sent to Pathology.  PROCEDURE IN DETAIL:  The patient was taken to the operating room.  She was intubated.  She was prepped and draped.  A uterine manipulator was inserted.  The Foley catheter was inserted.  Attention was turned to the abdomen.  A small infraumbilical incision was made.  The fascia was grasped with Kocher clamps, and the fascia was entered using Mayo scissors.  Corner stitches were placed using 0 Vicryl, and Hasson scope was introduced into the peritoneal cavity.  The gas was then insufflated and after pneumoperitoneum was achieved, the scope was introduced through the trocar.  The patient was placed gently in Trendelenburg position.  A 5-mm port was then placed suprapubically under direct visualization.  There were no adhesions noted.  Exam of the pelvis revealed the following and normal-appearing uterus.  No endometriosis noted.  No pelvic adhesions.  We could see evidence on the right fallopian tube in the midportion, permanent suture from her tubal reanastomosis.  On the left side, I could see a small bulge in the midportion probably where she had a reanastomosis.  I think this is where the chronic  ectopic was on the left.  I placed the EnSeal through the laparoscope, with direct visualization, I was able to cauterize and remove both fallopian tubes.  They were then placed in the anterior cul- de-sac.  I then converted the 5 to a 10, inserted the endobag, scooped both specimens into the bag, removed it without difficulty.  We released the pneumoperitoneum and removed the Hasson scope and tied the angle stitches down.  I closed the infraumbilical site with interrupted using 3-0 Vicryl and Dermabond.  The suprapubic was tied down, was closed with Dermabond.  All sponge, lap, and instrument counts were correct x2.  The Foley was removed at the end of the case. The patient was extubated and went to recovery room in stable condition.     Michelle L. Vincente Poli, M.D.     [name]  D:  12/14/2012  T:  12/14/2012  Job:  [id number]

## 2012-12-14 NOTE — Brief Op Note (Signed)
12/14/2012  8:22 AM  PATIENT:  Catherine Fuller  37 y.o. female  PRE-OPERATIVE DIAGNOSIS:  Chronic left ectopic pregnancy, desires permanent sterilization cpt [address], [phone]  POST-OPERATIVE DIAGNOSIS:  Same  PROCEDURE:  Procedure(s):  Open laparoscopy Bilateral salpingectomy (Bilateral) Bilateral SALPINGECTOMY (Bilateral)  SURGEON:  Surgeon(s) and Role:    * Jeani Hawking, MD - Primary  PHYSICIAN ASSISTANT:   ASSISTANTS: none   ANESTHESIA:   general  EBL:  Total I/O In: -  Out: 100 [Urine:100]  BLOOD ADMINISTERED:none  DRAINS: Urinary Catheter (Foley)   LOCAL MEDICATIONS USED:  XYLOCAINE   SPECIMEN:  Source of Specimen:  bilateral fallopian tubes  DISPOSITION OF SPECIMEN:  PATHOLOGY  COUNTS:  YES  TOURNIQUET:  * No tourniquets in log *  DICTATION: .Other Dictation: Dictation Number [id number]  PLAN OF CARE: Discharge to home after PACU  PATIENT DISPOSITION:  PACU - hemodynamically stable.   Delay start of Pharmacological VTE agent (>24hrs) due to surgical blood loss or risk of bleeding: not applicable

## 2012-12-14 NOTE — Anesthesia Procedure Notes (Signed)
Procedure Name: Intubation Date/Time: 12/14/2012 7:29 AM Performed by: STERLING, Jannet Askew Pre-anesthesia Checklist: Patient identified, Patient being monitored, Emergency Drugs available, Timeout performed and Suction available Patient Re-evaluated:Patient Re-evaluated prior to inductionOxygen Delivery Method: Circle system utilized Preoxygenation: Pre-oxygenation with 100% oxygen Intubation Type: IV induction Ventilation: Mask ventilation without difficulty Laryngoscope Size: Mac and 4 Grade View: Grade I Tube type: Oral Tube size: 7.0 mm Number of attempts: 1 Airway Equipment and Method: Patient positioned with wedge pillow Placement Confirmation: ETT inserted through vocal cords under direct vision,  positive ETCO2 and breath sounds checked- equal and bilateral Secured at: 21 cm Dental Injury: Teeth and Oropharynx as per pre-operative assessment

## 2012-12-14 NOTE — Transfer of Care (Signed)
Immediate Anesthesia Transfer of Care Note  Patient: Catherine Fuller  Procedure(s) Performed: Procedure(s):  Open laparoscopy Bilateral salpingectomy (Bilateral) Bilateral SALPINGECTOMY (Bilateral)  Patient Location: PACU  Anesthesia Type:General  Level of Consciousness: awake, alert  and oriented  Airway & Oxygen Therapy: Patient Spontanous Breathing and Patient connected to nasal cannula oxygen  Post-op Assessment: Report given to PACU RN and Post -op Vital signs reviewed and stable  Post vital signs: Reviewed and stable  Complications: No apparent anesthesia complications

## 2012-12-14 NOTE — Anesthesia Preprocedure Evaluation (Addendum)
Anesthesia Evaluation  Patient identified by MRN, date of birth, ID band Patient awake    Reviewed: Allergy & Precautions, H&P , NPO status , Patient's Chart, lab work & pertinent test results  History of Anesthesia Complications (+) PONV and Family history of anesthesia reaction  Airway Mallampati: II TM Distance: >3 FB Neck ROM: Full    Dental no notable dental hx. (+) Dental Advisory Given and Teeth Intact   Pulmonary neg pulmonary ROS,          Cardiovascular negative cardio ROS  Rhythm:Regular Rate:Normal     Neuro/Psych PSYCHIATRIC DISORDERS Depression negative neurological ROS  negative psych ROS   GI/Hepatic negative GI ROS, Neg liver ROS,   Endo/Other  negative endocrine ROS  Renal/GU negative Renal ROS     Musculoskeletal negative musculoskeletal ROS (+)   Abdominal Normal abdominal exam  (+)   Peds  Hematology negative hematology ROS (+)   Anesthesia Other Findings   Reproductive/Obstetrics negative OB ROS                          Anesthesia Physical Anesthesia Plan  ASA: II  Anesthesia Plan: General   Post-op Pain Management:    Induction: Intravenous  Airway Management Planned: Oral ETT  Additional Equipment:   Intra-op Plan:   Post-operative Plan: Extubation in OR  Informed Consent: I have reviewed the patients History and Physical, chart, labs and discussed the procedure including the risks, benefits and alternatives for the proposed anesthesia with the patient or authorized representative who has indicated his/her understanding and acceptance.   Dental advisory given  Plan Discussed with: CRNA, Anesthesiologist and Surgeon  Anesthesia Plan Comments:        Anesthesia Quick Evaluation

## 2012-12-15 ENCOUNTER — Encounter (HOSPITAL_COMMUNITY): Payer: Self-pay | Admitting: Obstetrics and Gynecology

## 2013-01-18 ENCOUNTER — Other Ambulatory Visit: Payer: Self-pay | Admitting: Obstetrics and Gynecology

## 2014-01-26 ENCOUNTER — Other Ambulatory Visit: Payer: Self-pay | Admitting: Dermatology

## 2014-03-03 ENCOUNTER — Other Ambulatory Visit: Payer: Self-pay | Admitting: Obstetrics and Gynecology

## 2014-07-27 ENCOUNTER — Other Ambulatory Visit (HOSPITAL_COMMUNITY): Payer: Self-pay | Admitting: Internal Medicine

## 2014-07-27 DIAGNOSIS — R1084 Generalized abdominal pain: Secondary | ICD-10-CM

## 2014-07-28 ENCOUNTER — Ambulatory Visit (HOSPITAL_COMMUNITY)
Admission: RE | Admit: 2014-07-28 | Discharge: 2014-07-28 | Disposition: A | Discharge status: Home / Self Care | Payer: 59 | Source: Ambulatory Visit | Attending: Internal Medicine | Admitting: Internal Medicine

## 2014-07-28 DIAGNOSIS — R1084 Generalized abdominal pain: Secondary | ICD-10-CM | POA: Insufficient documentation

## 2014-08-15 ENCOUNTER — Encounter (HOSPITAL_COMMUNITY): Payer: Self-pay | Admitting: Obstetrics and Gynecology

## 2015-02-01 IMAGING — US US ABDOMEN COMPLETE
1 series · 14 of 25 positions shown · non-contrast
Comparison: None.

CLINICAL DATA: Abdominal pain, right flank pain, initial encounter.

EXAM:
ULTRASOUND ABDOMEN COMPLETE

[Series 1: us abdomen complete · 0.20mm/px · 14 of 78 slices shown]
[im 1/78]
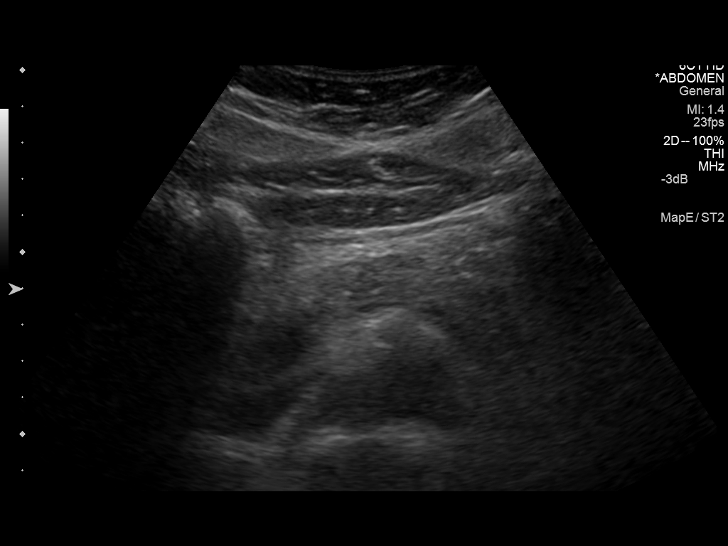
[im 7/78]
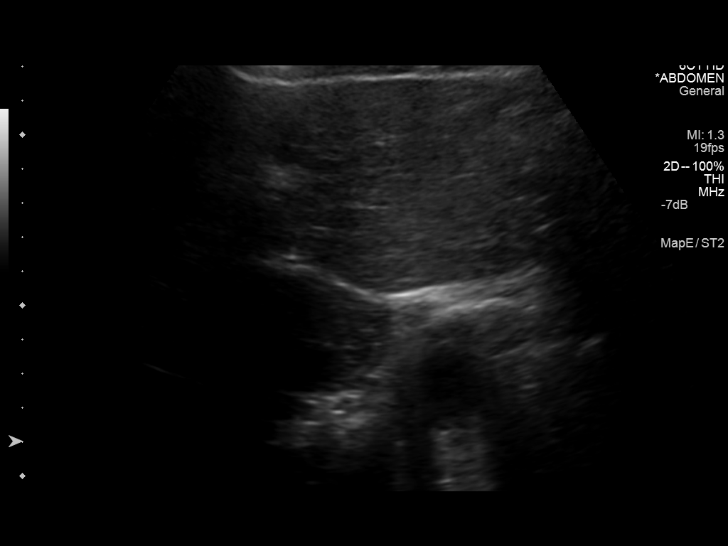
[im 13/78]
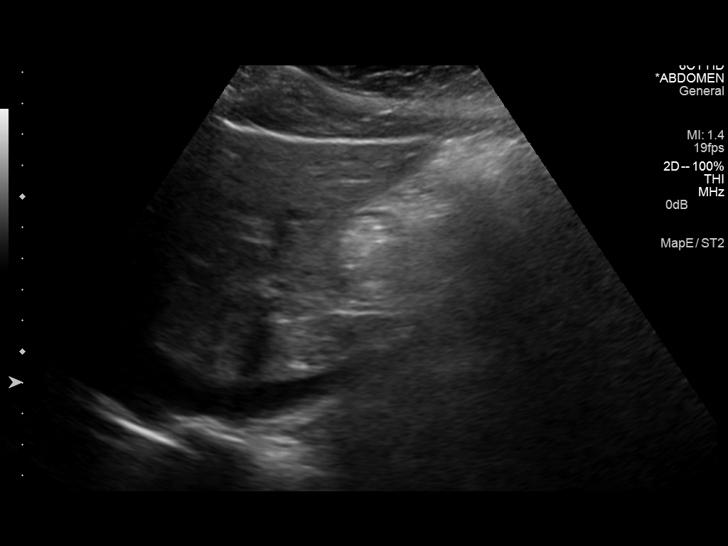
[im 20/78]
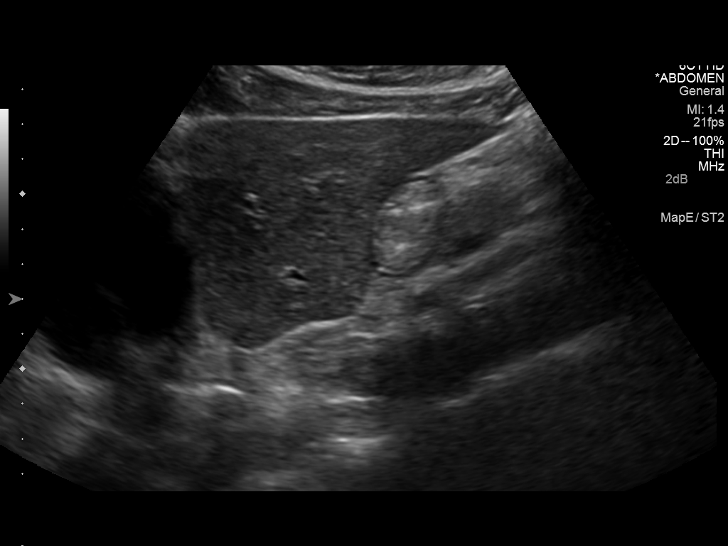
[im 26/78]
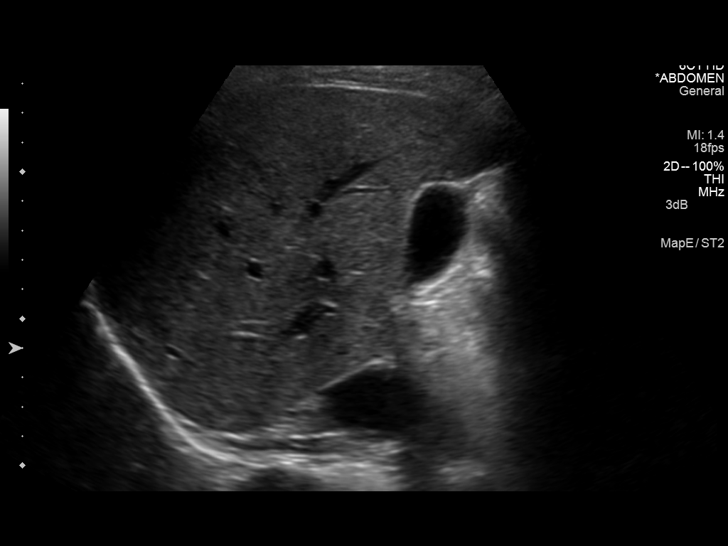
[im 29/78]
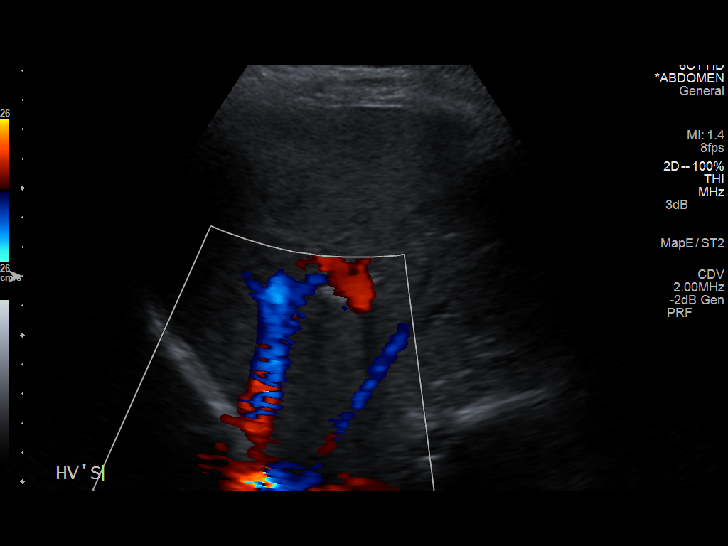
[im 36/78]
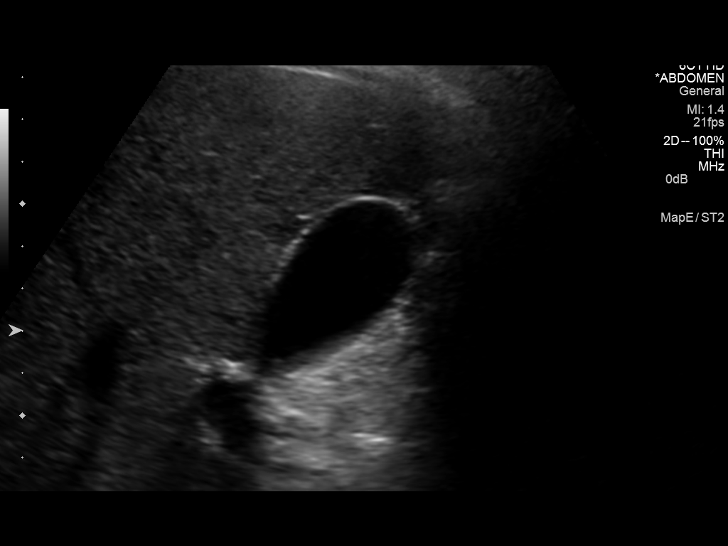
[im 42/78]
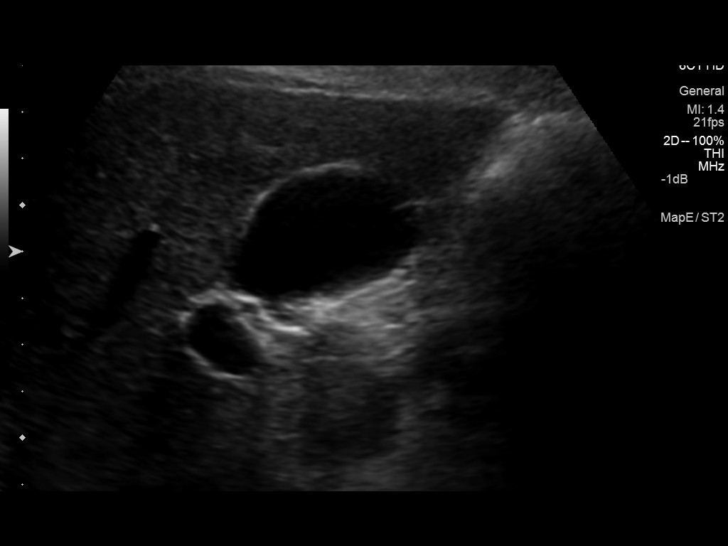
[im 49/78]
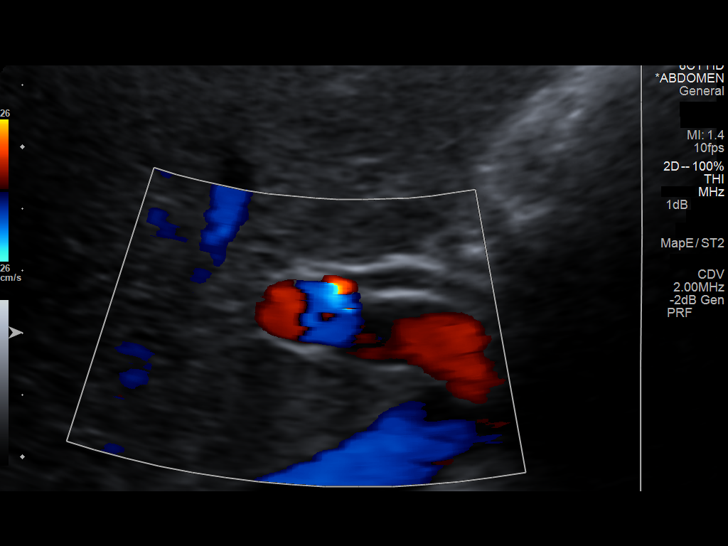
[im 52/78]
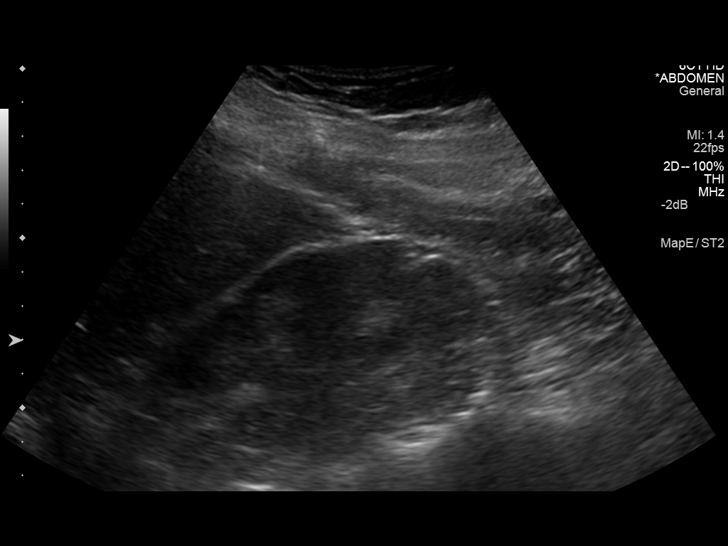
[im 58/78]
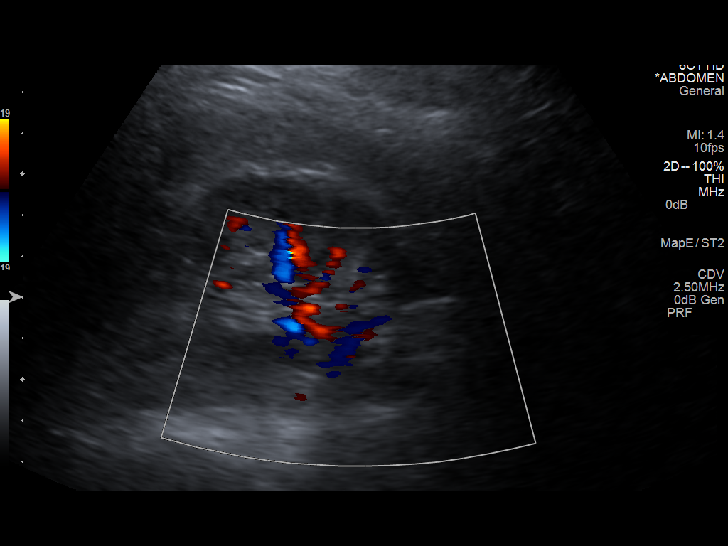
[im 65/78]
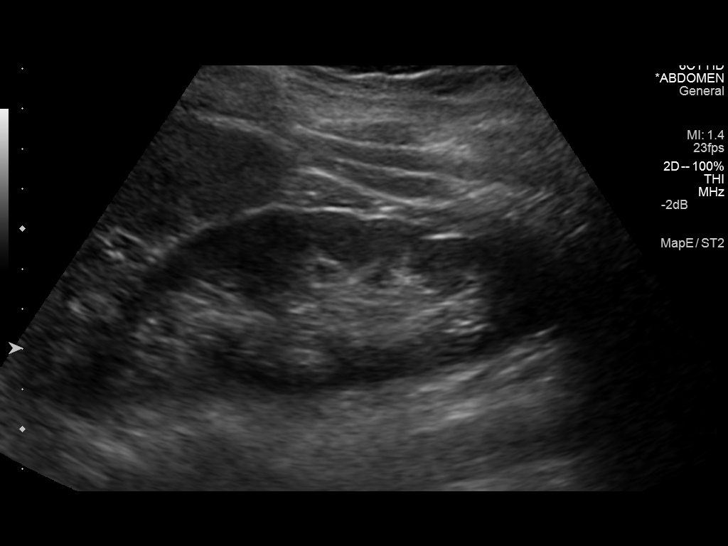
[im 71/78]
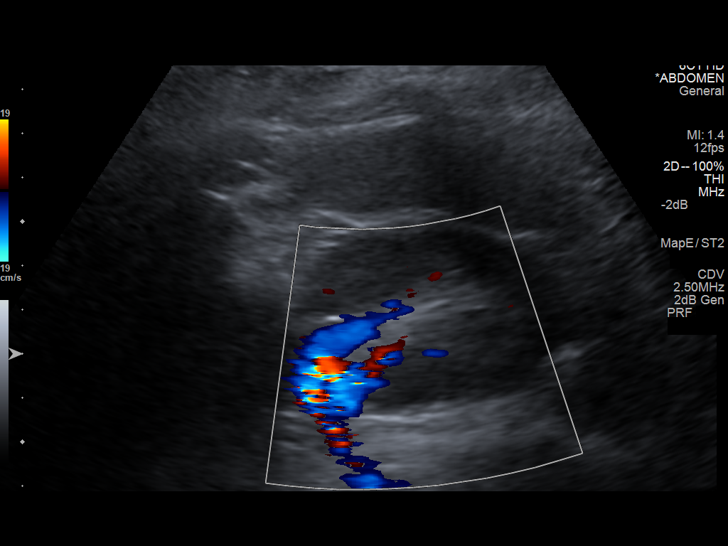
[im 78/78]
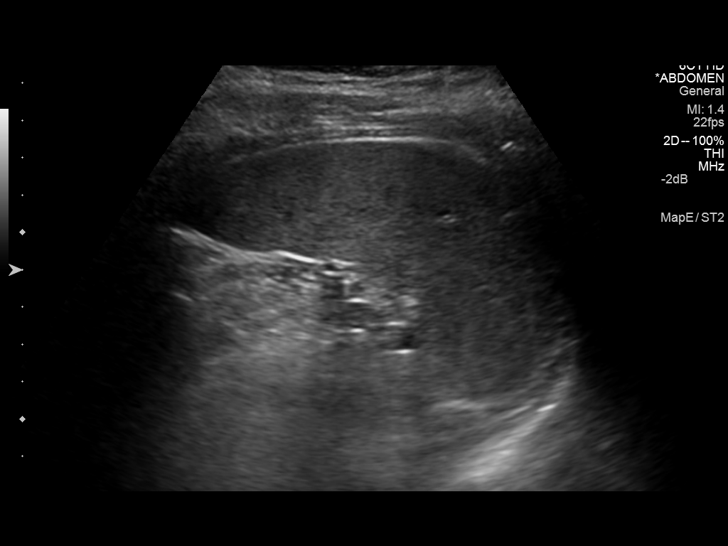

[14 of 25 positions shown; findings below may reference images not displayed]

FINDINGS: Gallbladder: No gallstones or wall thickening visualized. No
sonographic Murphy sign noted.

Common bile duct: Diameter: 2 mm, within normal limits.

Liver: No focal lesion identified. Within normal limits in
parenchymal echogenicity.

IVC: No abnormality visualized.

Pancreas: Visualized portion unremarkable.

Spleen: 8.7 cm, negative.

Right Kidney: Length: 12.0 cm. Parenchymal echogenicity is within
normal limits. No hydronephrosis. No focal lesion.

Left Kidney: Length: 12.7 cm. Parenchymal echogenicity is within
normal limits. No hydronephrosis. No focal lesion.

Abdominal aorta: No aneurysm visualized.

Other findings: None.
IMPRESSION: Negative.

## 2015-11-01 MED FILL — FLUoxetine HCL 20 MG CAPS: 20 | 90 days supply | Qty: 90 | Fill #1

## 2016-02-09 MED FILL — FLUoxetine HCL 20 MG CAPS: 20 | 90 days supply | Qty: 90 | Fill #2

## 2016-04-25 MED FILL — clonazePAM 1 MG TABS: 1 | 30 days supply | Qty: 30 | Fill #0

## 2016-05-14 MED FILL — FLUoxetine HCL 20 MG CAPS: 20 | 90 days supply | Qty: 90 | Fill #3

## 2016-08-28 DIAGNOSIS — Z6834 Body mass index (BMI) 34.0-34.9, adult: Secondary | ICD-10-CM | POA: Diagnosis not present

## 2016-08-28 DIAGNOSIS — Z01419 Encounter for gynecological examination (general) (routine) without abnormal findings: Secondary | ICD-10-CM | POA: Diagnosis not present

## 2016-09-11 DIAGNOSIS — Z6834 Body mass index (BMI) 34.0-34.9, adult: Secondary | ICD-10-CM | POA: Diagnosis not present

## 2016-09-11 DIAGNOSIS — Z1389 Encounter for screening for other disorder: Secondary | ICD-10-CM | POA: Diagnosis not present

## 2016-09-11 DIAGNOSIS — E669 Obesity, unspecified: Secondary | ICD-10-CM | POA: Diagnosis not present

## 2016-09-11 DIAGNOSIS — M25562 Pain in left knee: Secondary | ICD-10-CM | POA: Diagnosis not present

## 2016-09-11 DIAGNOSIS — Z0000 Encounter for general adult medical examination without abnormal findings: Secondary | ICD-10-CM | POA: Diagnosis not present

## 2016-09-11 DIAGNOSIS — F418 Other specified anxiety disorders: Secondary | ICD-10-CM | POA: Diagnosis not present

## 2016-09-11 DIAGNOSIS — R635 Abnormal weight gain: Secondary | ICD-10-CM | POA: Diagnosis not present

## 2016-09-11 MED FILL — clonazePAM 1 MG TABS: 1 | 30 days supply | Qty: 30 | Fill #0

## 2016-09-11 MED FILL — FLUoxetine HCL 20 MG CAPS: 20 | 90 days supply | Qty: 90 | Fill #0

## 2016-09-19 DIAGNOSIS — L821 Other seborrheic keratosis: Secondary | ICD-10-CM | POA: Diagnosis not present

## 2016-09-19 DIAGNOSIS — D2372 Other benign neoplasm of skin of left lower limb, including hip: Secondary | ICD-10-CM | POA: Diagnosis not present

## 2016-09-19 DIAGNOSIS — Z86018 Personal history of other benign neoplasm: Secondary | ICD-10-CM | POA: Diagnosis not present

## 2016-09-19 DIAGNOSIS — D2361 Other benign neoplasm of skin of right upper limb, including shoulder: Secondary | ICD-10-CM | POA: Diagnosis not present

## 2016-09-19 DIAGNOSIS — D2261 Melanocytic nevi of right upper limb, including shoulder: Secondary | ICD-10-CM | POA: Diagnosis not present

## 2016-09-19 DIAGNOSIS — Z23 Encounter for immunization: Secondary | ICD-10-CM | POA: Diagnosis not present

## 2016-09-19 DIAGNOSIS — D2262 Melanocytic nevi of left upper limb, including shoulder: Secondary | ICD-10-CM | POA: Diagnosis not present

## 2016-09-19 DIAGNOSIS — D225 Melanocytic nevi of trunk: Secondary | ICD-10-CM | POA: Diagnosis not present

## 2016-09-19 DIAGNOSIS — Z808 Family history of malignant neoplasm of other organs or systems: Secondary | ICD-10-CM | POA: Diagnosis not present

## 2016-12-17 MED FILL — FLUoxetine HCL 20 MG CAPS: 20 | 90 days supply | Qty: 90 | Fill #1

## 2017-02-27 MED FILL — clonazePAM 1 MG TABS: 1 | 30 days supply | Qty: 30 | Fill #1

## 2017-04-04 MED FILL — FLUoxetine HCL 20 MG CAPS: 20 | 90 days supply | Qty: 90 | Fill #0

## 2017-07-07 MED FILL — FLUoxetine HCL 20 MG CAPS: 20 | 90 days supply | Qty: 90 | Fill #1

## 2017-09-12 DIAGNOSIS — F418 Other specified anxiety disorders: Secondary | ICD-10-CM | POA: Diagnosis not present

## 2017-09-12 DIAGNOSIS — Z0000 Encounter for general adult medical examination without abnormal findings: Secondary | ICD-10-CM | POA: Diagnosis not present

## 2017-09-12 DIAGNOSIS — Z6827 Body mass index (BMI) 27.0-27.9, adult: Secondary | ICD-10-CM | POA: Diagnosis not present

## 2017-09-12 MED FILL — clonazePAM 1 MG TABS: 1 | 30 days supply | Qty: 30 | Fill #0

## 2017-09-16 DIAGNOSIS — Z6826 Body mass index (BMI) 26.0-26.9, adult: Secondary | ICD-10-CM | POA: Diagnosis not present

## 2017-09-16 DIAGNOSIS — Z01419 Encounter for gynecological examination (general) (routine) without abnormal findings: Secondary | ICD-10-CM | POA: Diagnosis not present

## 2017-09-18 MED FILL — FLUoxetine HCL 20 MG CAPS: 20 | 90 days supply | Qty: 90 | Fill #0

## 2017-09-23 DIAGNOSIS — D2372 Other benign neoplasm of skin of left lower limb, including hip: Secondary | ICD-10-CM | POA: Diagnosis not present

## 2017-09-23 DIAGNOSIS — Z23 Encounter for immunization: Secondary | ICD-10-CM | POA: Diagnosis not present

## 2017-09-23 DIAGNOSIS — Z808 Family history of malignant neoplasm of other organs or systems: Secondary | ICD-10-CM | POA: Diagnosis not present

## 2017-09-23 DIAGNOSIS — D2261 Melanocytic nevi of right upper limb, including shoulder: Secondary | ICD-10-CM | POA: Diagnosis not present

## 2017-09-23 DIAGNOSIS — D2262 Melanocytic nevi of left upper limb, including shoulder: Secondary | ICD-10-CM | POA: Diagnosis not present

## 2017-09-23 DIAGNOSIS — D2361 Other benign neoplasm of skin of right upper limb, including shoulder: Secondary | ICD-10-CM | POA: Diagnosis not present

## 2017-09-23 DIAGNOSIS — D485 Neoplasm of uncertain behavior of skin: Secondary | ICD-10-CM | POA: Diagnosis not present

## 2017-09-23 DIAGNOSIS — D225 Melanocytic nevi of trunk: Secondary | ICD-10-CM | POA: Diagnosis not present

## 2017-09-23 DIAGNOSIS — Z86018 Personal history of other benign neoplasm: Secondary | ICD-10-CM | POA: Diagnosis not present

## 2018-01-22 MED FILL — FLUoxetine HCL 20 MG CAPS: 20 | 90 days supply | Qty: 90 | Fill #1

## 2018-02-19 MED FILL — clonazePAM 1 MG TABS: 1 | 30 days supply | Qty: 30 | Fill #1

## 2018-06-11 MED FILL — FLUoxetine HCL 20 MG CAPS: 20 | 90 days supply | Qty: 90 | Fill #0

## 2018-09-15 ENCOUNTER — Other Ambulatory Visit: Payer: Self-pay | Admitting: Internal Medicine

## 2018-09-15 DIAGNOSIS — R1033 Periumbilical pain: Secondary | ICD-10-CM

## 2018-09-17 ENCOUNTER — Ambulatory Visit
Admission: RE | Admit: 2018-09-17 | Discharge: 2018-09-17 | Disposition: A | Discharge status: Home / Self Care | Payer: No Typology Code available for payment source | Source: Ambulatory Visit | Attending: Internal Medicine | Admitting: Internal Medicine

## 2018-09-17 DIAGNOSIS — R1033 Periumbilical pain: Secondary | ICD-10-CM

## 2018-10-02 MED FILL — FLUoxetine HCL 20 MG CAPS: 20 | 90 days supply | Qty: 90 | Fill #1

## 2018-11-13 ENCOUNTER — Other Ambulatory Visit: Payer: Self-pay | Admitting: Gastroenterology

## 2018-11-23 ENCOUNTER — Ambulatory Visit (HOSPITAL_COMMUNITY)
Admission: RE | Admit: 2018-11-23 | Discharge: 2018-11-23 | Disposition: A | Discharge status: Home / Self Care | Payer: No Typology Code available for payment source | Source: Ambulatory Visit | Attending: Gastroenterology | Admitting: Gastroenterology

## 2018-11-23 ENCOUNTER — Encounter (HOSPITAL_COMMUNITY): Payer: Self-pay

## 2018-11-23 DIAGNOSIS — R1032 Left lower quadrant pain: Secondary | ICD-10-CM | POA: Diagnosis not present

## 2018-11-23 MED ORDER — SODIUM CHLORIDE (PF) 0.9 % IJ SOLN
INTRAMUSCULAR | Status: AC
  Filled 2018-11-23: qty 50

## 2018-11-23 MED ORDER — IOHEXOL 300 MG/ML  SOLN
100.0000 mL | Freq: Once | INTRAMUSCULAR | Status: AC | PRN
  Administered 2018-11-23: 100 mL via INTRAVENOUS

## 2019-01-06 MED FILL — FLUoxetine HCL 20 MG CAPS: 20 | 90 days supply | Qty: 90 | Fill #0

## 2019-01-06 MED FILL — clonazePAM 1 MG TABS: 1 | 30 days supply | Qty: 30 | Fill #0

## 2019-04-13 MED FILL — PROAIR RESPICLICK INHAL PWD: 108 (90 BAS | 25 days supply | Qty: 1 | Fill #0

## 2019-04-13 MED FILL — FLUoxetine HCL 20 MG CAPS: 20 | 90 days supply | Qty: 90 | Fill #0

## 2019-04-19 MED FILL — OMEPRAZOLE 20 MG CPDR: 20 | 30 days supply | Qty: 30 | Fill #0

## 2019-05-27 MED FILL — clonazePAM 1 MG TABS: 1 | 30 days supply | Qty: 30 | Fill #1

## 2019-05-30 IMAGING — CT CT ABD-PELV W/ CM
2 of 4 series · 16 of 46 positions shown, 18 images · IV contrast (OMNIPAQUE)
Comparison: None.

CLINICAL DATA: Chronic left abdominal pain x4 years

EXAM:
CT ABDOMEN AND PELVIS WITH CONTRAST
TECHNIQUE: Multidetector CT imaging of the abdomen and pelvis was performed
using the standard protocol following bolus administration of
intravenous contrast.
CONTRAST:  100mL OMNIPAQUE IOHEXOL 300 MG/ML  SOLN

[Series 2: axial st · axial · 0.73mm/px · z∈[+907,+1312]mm · 13 of 93 slices shown, 15 images]
[im 6/93  soft-tissue]
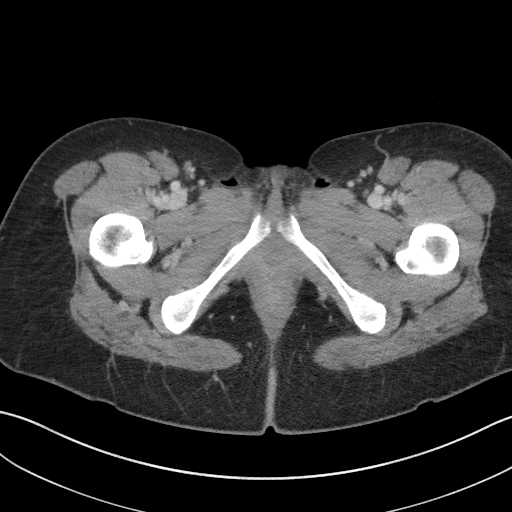
[im 6/93  bone]
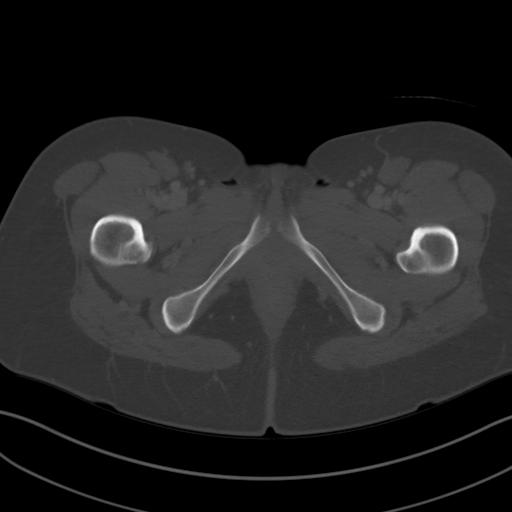
[im 11/93  soft-tissue]
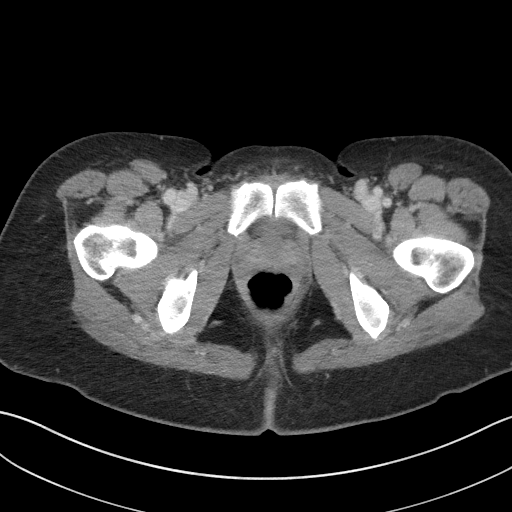
[im 22/93  soft-tissue]
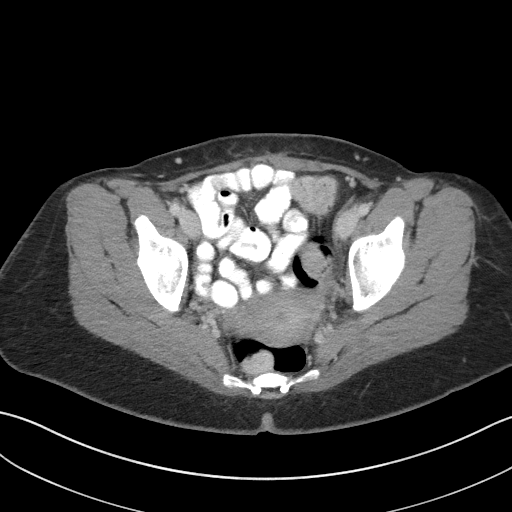
[im 28/93  soft-tissue]
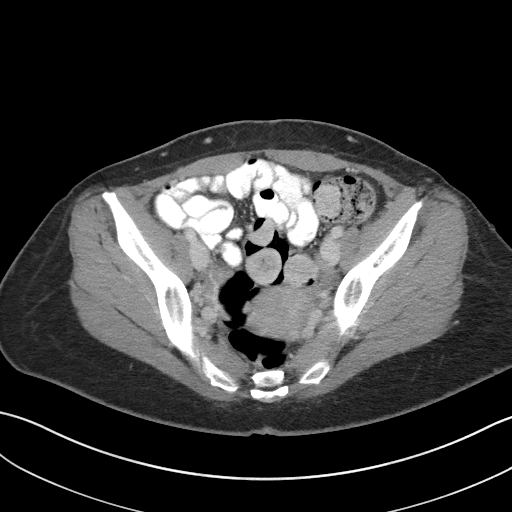
[im 33/93  soft-tissue]
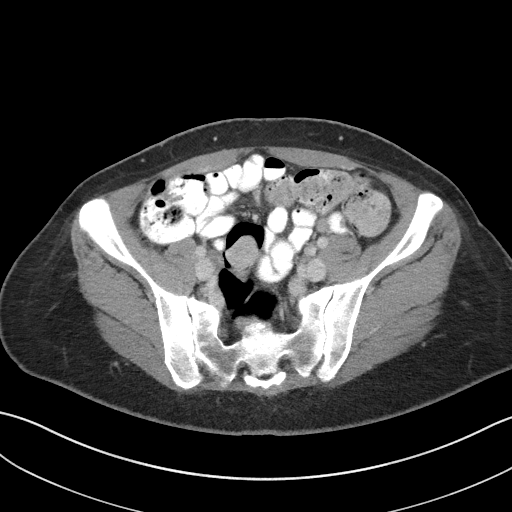
[im 38/93  soft-tissue]
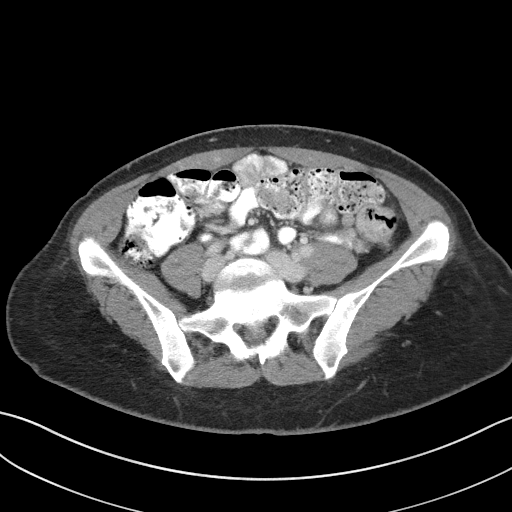
[im 49/93  soft-tissue]
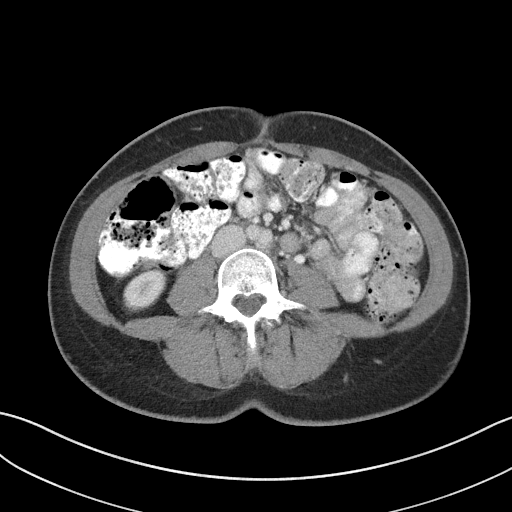
[im 55/93  soft-tissue]
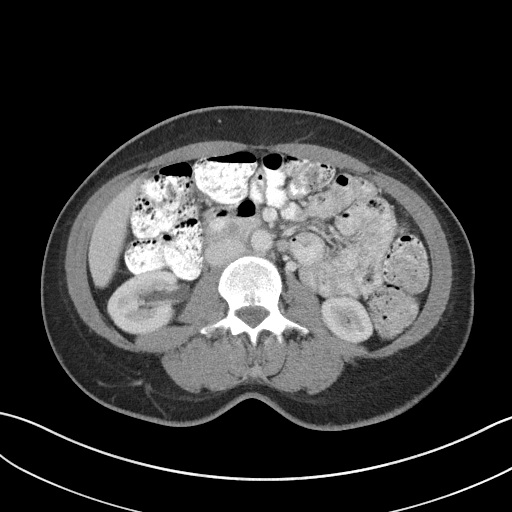
[im 60/93  soft-tissue]
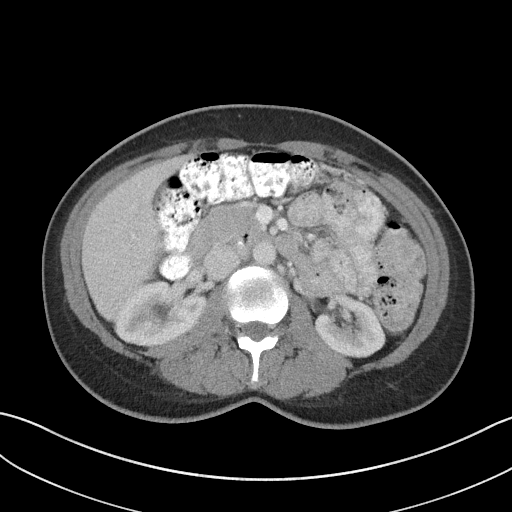
[im 60/93  bone]
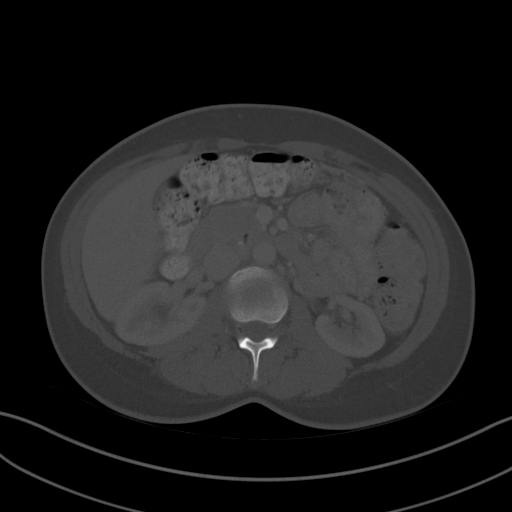
[im 65/93  soft-tissue]
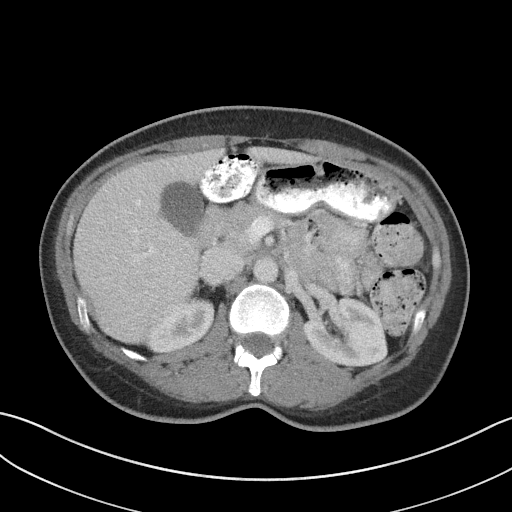
[im 71/93  soft-tissue]
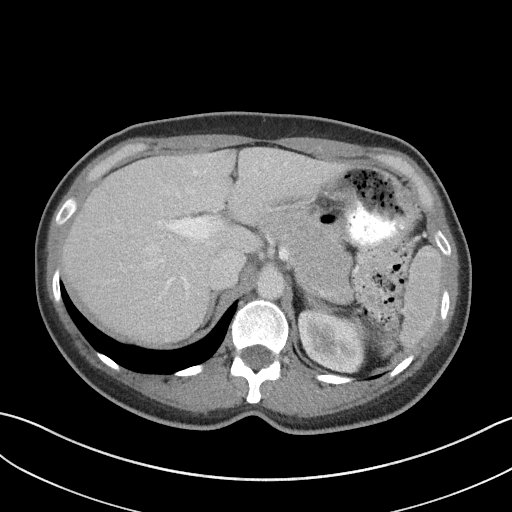
[im 82/93  soft-tissue]
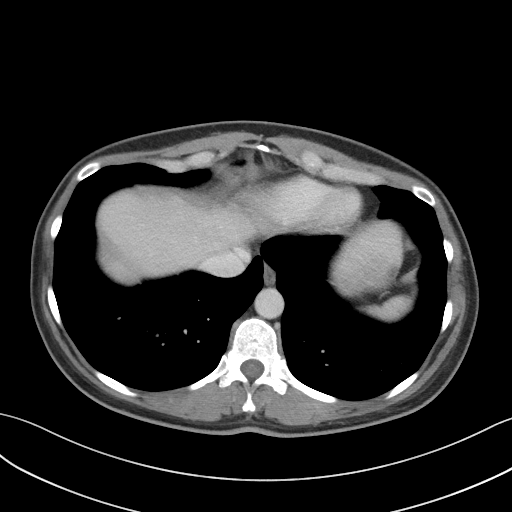
[im 87/93  soft-tissue]
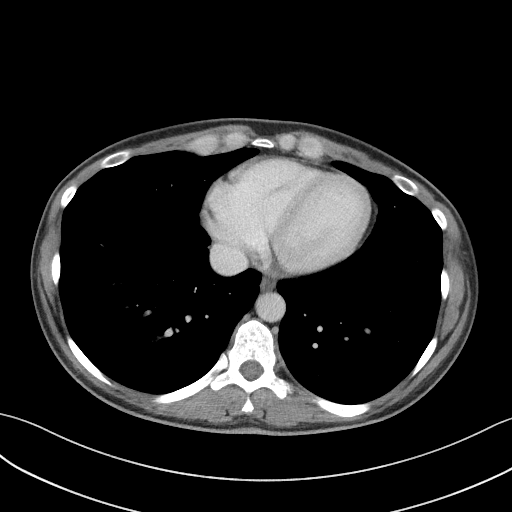

[Series 4: coronal st · coronal · 0.68mm/px · 3 of 84 slices shown]
[im 28/84  soft-tissue]
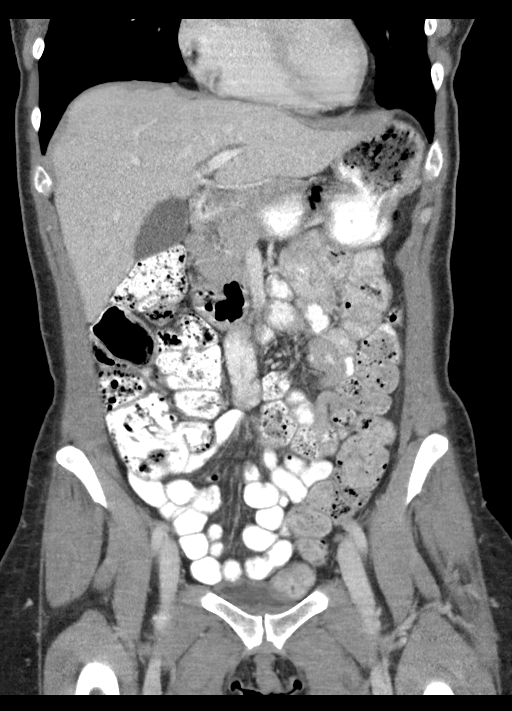
[im 37/84  soft-tissue]
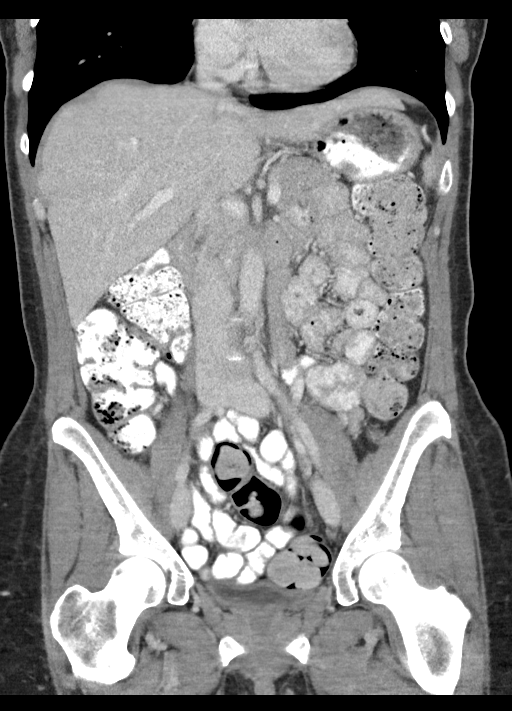
[im 47/84  soft-tissue]
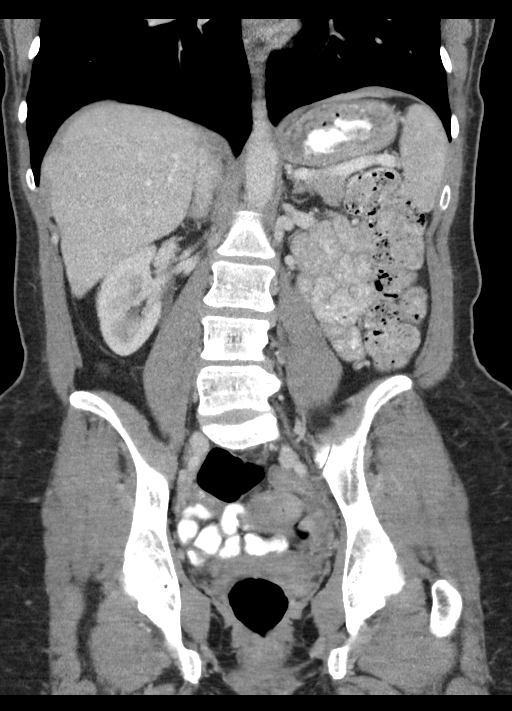

[16 of 46 positions shown; findings below may reference images not displayed]

FINDINGS: Lower chest: Lung bases are clear.

Hepatobiliary: Liver is within normal limits.

Gallbladder is unremarkable. No intrahepatic or extrahepatic ductal
dilatation.

Pancreas: Within normal limits.

Spleen: Within normal limits.

Adrenals/Urinary Tract: Adrenal glands are within normal limits.

Kidneys are within normal limits.  No hydronephrosis.

Bladder is thick-walled although underdistended.

Stomach/Bowel: Stomach is within normal limits.

No evidence of bowel obstruction.

Normal appendix (series 2/image 56).

Mild left colonic stool burden.

Vascular/Lymphatic: No evidence of abdominal aortic aneurysm.

No suspicious abdominopelvic lymphadenopathy.

Reproductive: Uterus is mildly heterogeneous.

No adnexal masses.

Other: No abdominopelvic ascites.

Musculoskeletal: Visualized osseous structures are within normal
limits.
IMPRESSION: Negative CT abdomen/pelvis.

No CT findings to account for the patient's chronic left abdominal
pain.

## 2019-07-16 MED FILL — FLUoxetine HCL 20 MG CAPS: 20 | 90 days supply | Qty: 90 | Fill #0

## 2019-11-16 MED FILL — FLUoxetine HCL 20 MG CAPS: 20 | 90 days supply | Qty: 90 | Fill #1

## 2020-01-05 MED FILL — clonazePAM 1 MG TABS: 1 | 30 days supply | Qty: 30 | Fill #0

## 2020-03-02 MED FILL — FLUoxetine HCL 20 MG CAPS: 20 | 90 days supply | Qty: 90 | Fill #0

## 2020-06-01 MED FILL — FLUoxetine HCL 20 MG CAPS: 20 | 90 days supply | Qty: 90 | Fill #1

## 2020-06-25 IMAGING — US US ABDOMEN COMPLETE
1 series · 14 of 25 positions shown · non-contrast
Comparison: 07/28/2014

CLINICAL DATA: Acute mid abdominal pain

EXAM:
ABDOMEN ULTRASOUND COMPLETE

[Series 1: us abdomen complete · 0.15mm/px · 14 of 76 slices shown]
[im 1/76]
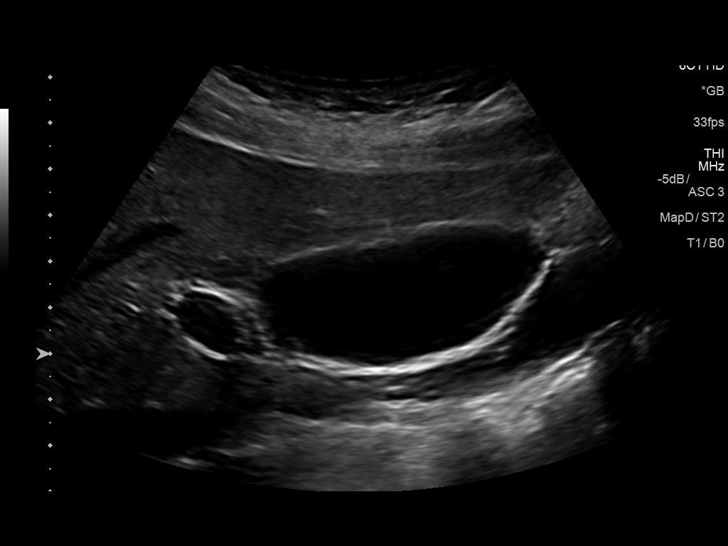
[im 7/76]
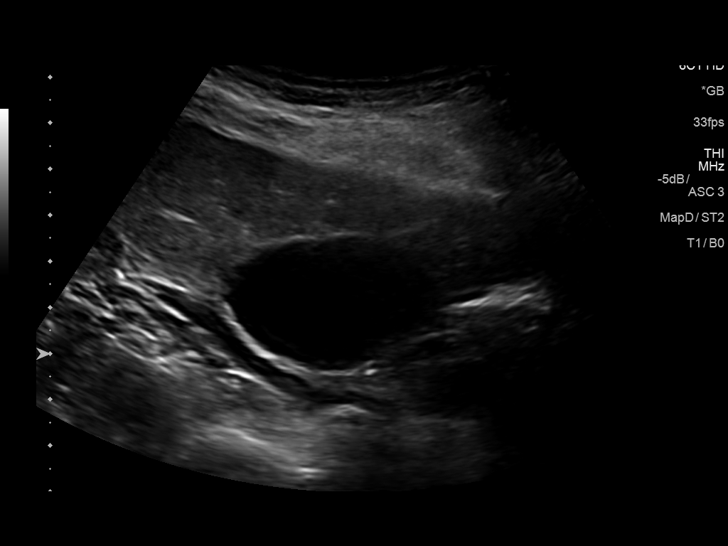
[im 13/76]
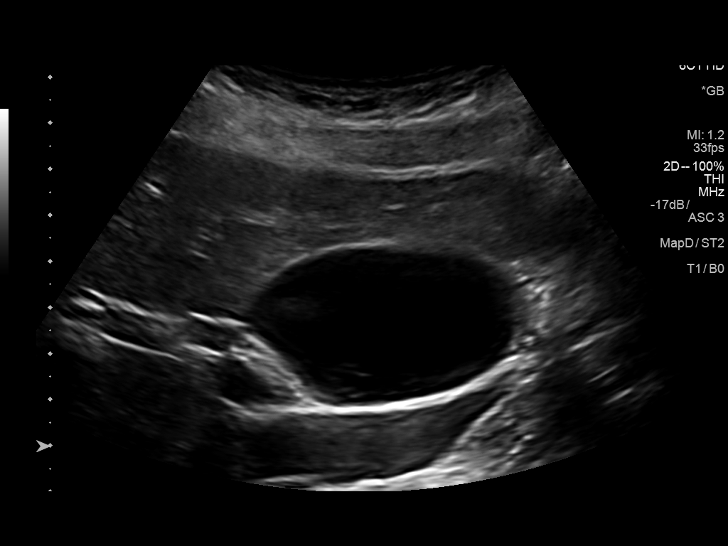
[im 19/76]
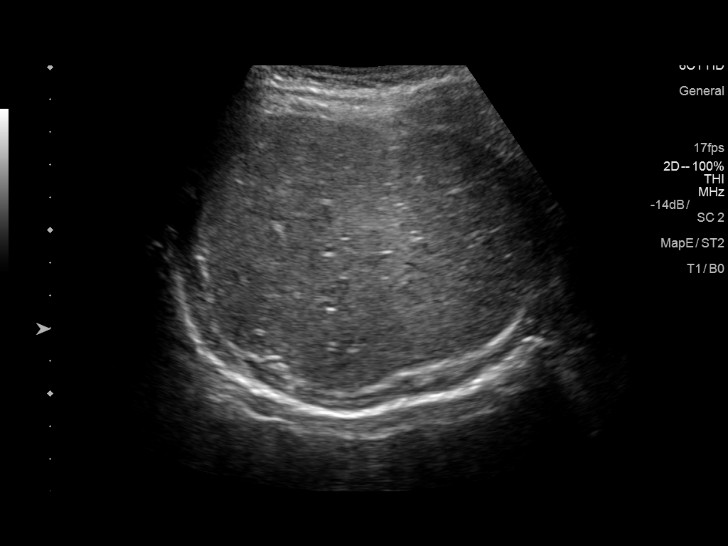
[im 26/76]
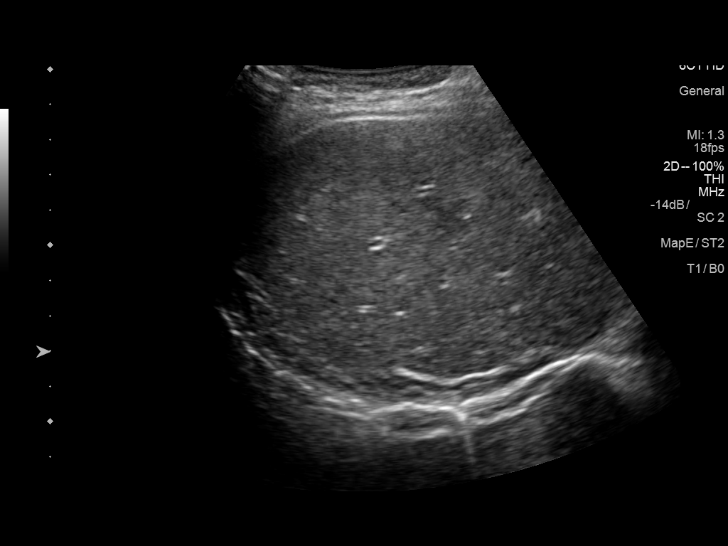
[im 29/76]
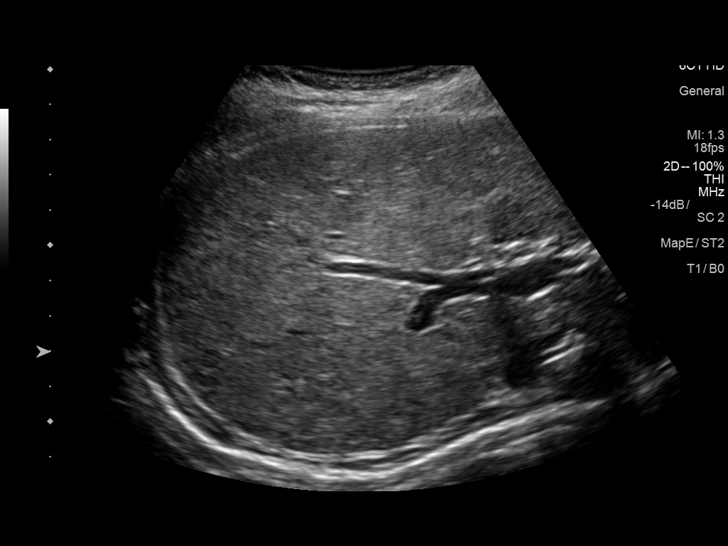
[im 35/76]
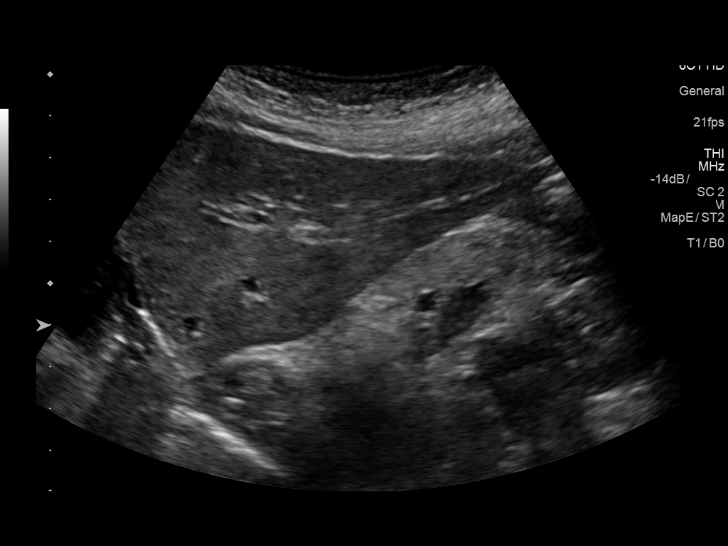
[im 41/76]
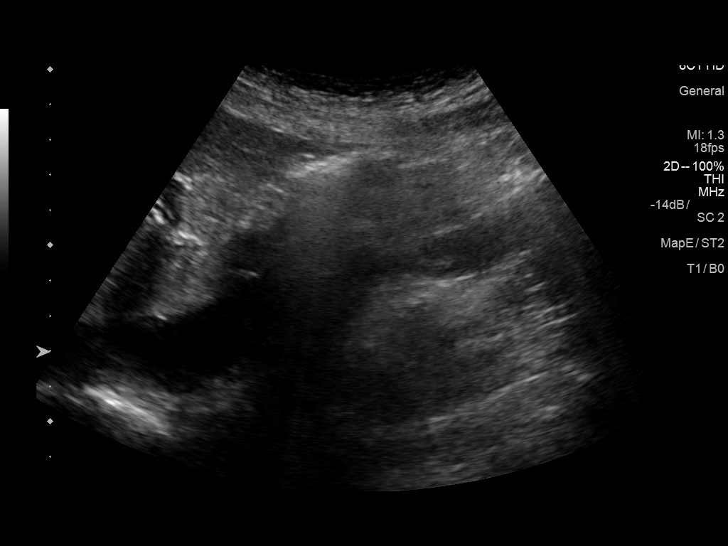
[im 47/76]
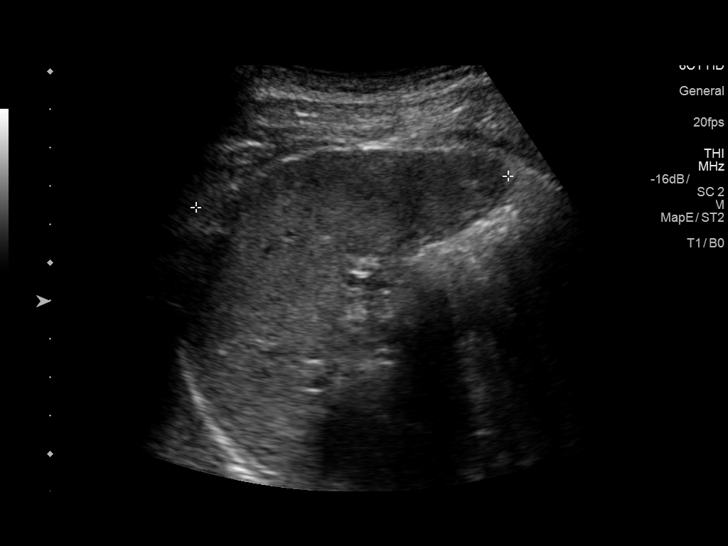
[im 51/76]
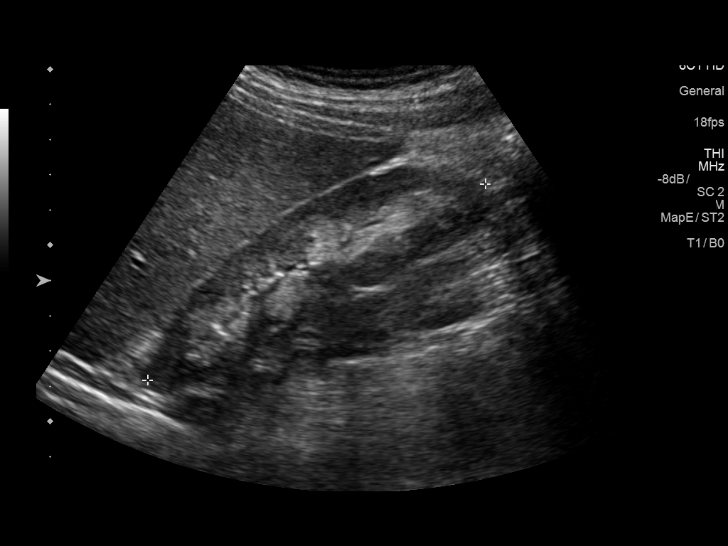
[im 57/76]
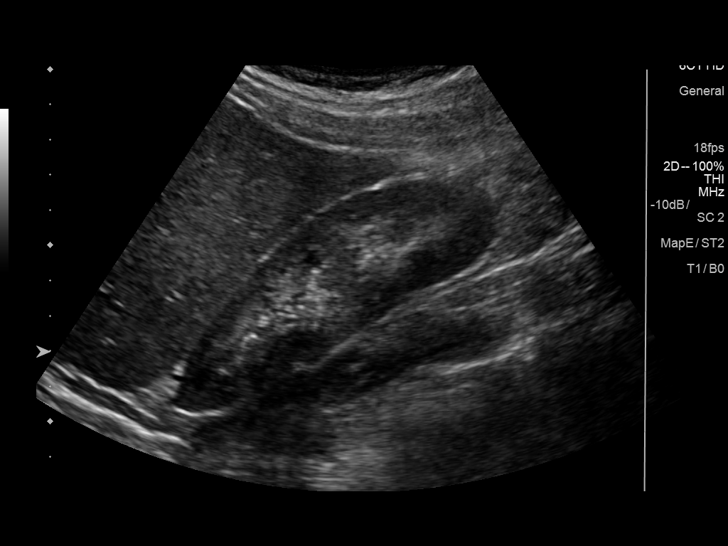
[im 63/76]
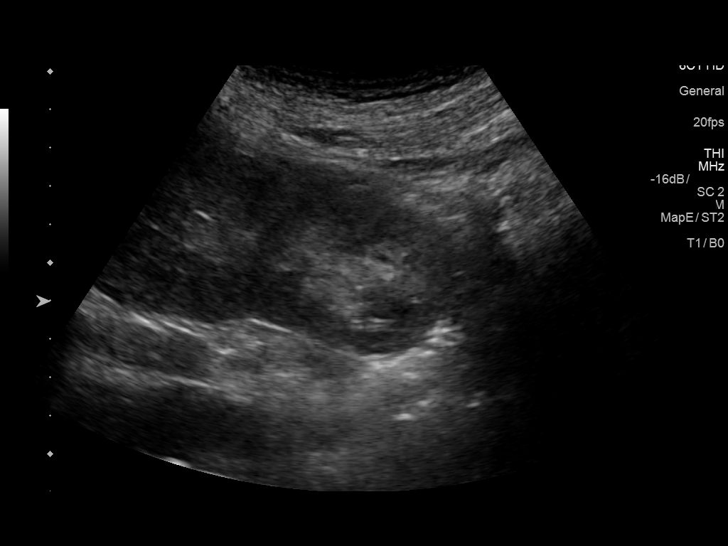
[im 69/76]
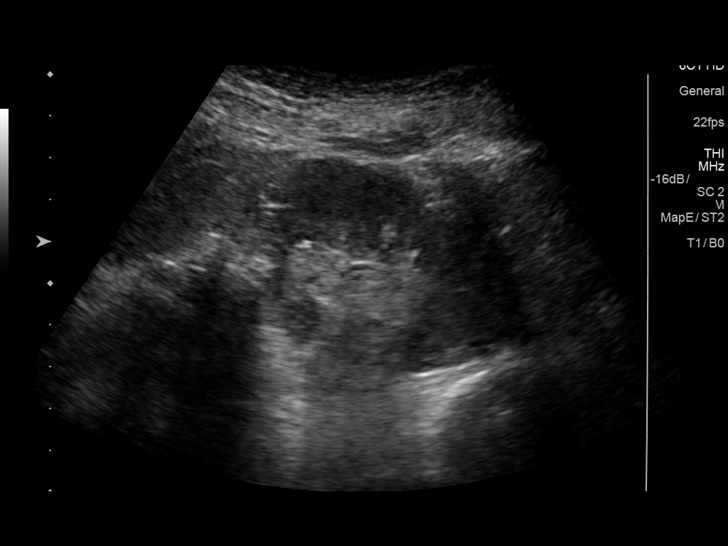
[im 76/76]
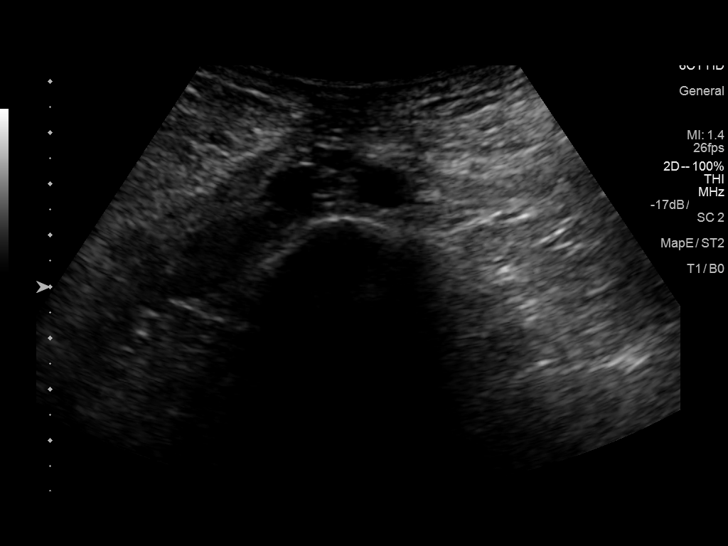

[14 of 25 positions shown; findings below may reference images not displayed]

FINDINGS: Gallbladder: No gallstones or wall thickening visualized. No
sonographic Murphy sign noted by sonographer.

Common bile duct: Diameter: 2.4 mm

Liver: No focal lesion identified. Within normal limits in
parenchymal echogenicity. Portal vein is patent on color Doppler
imaging with normal direction of blood flow towards the liver.

IVC: No abnormality visualized.

Pancreas: Visualized portion unremarkable.

Spleen: Size and appearance within normal limits.

Right Kidney: Length: 11.3 cm. Echogenicity within normal limits. No
mass or hydronephrosis visualized.

Left Kidney: Length: 10.6 cm. Echogenicity within normal limits. No
mass or hydronephrosis visualized.

Abdominal aorta: No aneurysm visualized.

Other findings: No ascites or free fluid
IMPRESSION: Normal abdominal ultrasound for age

## 2020-09-19 ENCOUNTER — Other Ambulatory Visit (HOSPITAL_COMMUNITY): Payer: Self-pay | Admitting: Family Medicine

## 2020-09-19 MED FILL — FLUoxetine HCL 20 MG CAPS: 20 | 90 days supply | Qty: 90 | Fill #0

## 2020-12-15 MED FILL — FLUoxetine HCL 20 MG CAPS: 20 | 90 days supply | Qty: 90 | Fill #1

## 2021-02-22 ENCOUNTER — Other Ambulatory Visit (HOSPITAL_COMMUNITY): Payer: Self-pay

## 2021-02-22 MED ORDER — CLINDAMYCIN PHOSPHATE 1 % EX SOLN
CUTANEOUS | 3 refills | Status: AC
  Filled 2021-02-22: qty 30, 14d supply, fill #0

## 2021-04-09 ENCOUNTER — Other Ambulatory Visit (HOSPITAL_COMMUNITY): Payer: Self-pay

## 2021-04-09 MED ORDER — CLONAZEPAM 1 MG PO TABS
1.0000 mg | ORAL_TABLET | Freq: Every day | ORAL | 0 refills | Status: AC
  Filled 2021-04-09: qty 30, 30d supply, fill #0

## 2021-04-09 MED ORDER — FLUOXETINE HCL 20 MG PO CAPS
20.0000 mg | ORAL_CAPSULE | Freq: Every day | ORAL | 1 refills | Status: DC
  Filled 2021-04-09: qty 90, 90d supply, fill #0
  Filled 2021-07-13: qty 90, 90d supply, fill #1

## 2021-04-13 ENCOUNTER — Other Ambulatory Visit (HOSPITAL_COMMUNITY): Payer: Self-pay

## 2021-04-17 ENCOUNTER — Other Ambulatory Visit (HOSPITAL_COMMUNITY): Payer: Self-pay

## 2021-07-13 ENCOUNTER — Other Ambulatory Visit (HOSPITAL_COMMUNITY): Payer: Self-pay

## 2021-07-16 ENCOUNTER — Other Ambulatory Visit (HOSPITAL_COMMUNITY): Payer: Self-pay

## 2021-12-05 ENCOUNTER — Other Ambulatory Visit (HOSPITAL_COMMUNITY): Payer: Self-pay

## 2021-12-05 MED ORDER — FLUOXETINE HCL 20 MG PO CAPS
20.0000 mg | ORAL_CAPSULE | Freq: Every day | ORAL | 0 refills | Status: DC
  Filled 2021-12-05: qty 90, 90d supply, fill #0

## 2021-12-05 MED ORDER — FLUOXETINE HCL 20 MG PO CAPS
20.0000 mg | ORAL_CAPSULE | Freq: Every day | ORAL | 0 refills | Status: AC
  Filled 2021-12-05: qty 90, 90d supply, fill #0

## 2021-12-06 ENCOUNTER — Other Ambulatory Visit (HOSPITAL_COMMUNITY): Payer: Self-pay

## 2022-03-28 ENCOUNTER — Other Ambulatory Visit (HOSPITAL_COMMUNITY): Payer: Self-pay

## 2022-03-28 MED ORDER — FLUOXETINE HCL 20 MG PO CAPS
20.0000 mg | ORAL_CAPSULE | Freq: Every day | ORAL | 1 refills | Status: DC
  Filled 2022-03-28: qty 90, 90d supply, fill #0
  Filled 2022-06-21: qty 90, 90d supply, fill #1

## 2022-04-01 ENCOUNTER — Other Ambulatory Visit (HOSPITAL_COMMUNITY): Payer: Self-pay

## 2022-05-30 ENCOUNTER — Other Ambulatory Visit (HOSPITAL_COMMUNITY): Payer: Self-pay

## 2022-05-31 ENCOUNTER — Other Ambulatory Visit (HOSPITAL_COMMUNITY): Payer: Self-pay

## 2022-06-21 ENCOUNTER — Other Ambulatory Visit (HOSPITAL_COMMUNITY): Payer: Self-pay

## 2022-07-25 ENCOUNTER — Other Ambulatory Visit (HOSPITAL_COMMUNITY): Payer: Self-pay

## 2022-07-29 ENCOUNTER — Other Ambulatory Visit (HOSPITAL_COMMUNITY): Payer: Self-pay

## 2022-07-30 ENCOUNTER — Other Ambulatory Visit (HOSPITAL_COMMUNITY): Payer: Self-pay

## 2022-07-31 ENCOUNTER — Other Ambulatory Visit (HOSPITAL_COMMUNITY): Payer: Self-pay

## 2022-08-01 ENCOUNTER — Other Ambulatory Visit (HOSPITAL_COMMUNITY): Payer: Self-pay

## 2022-08-01 MED ORDER — CLONAZEPAM 1 MG PO TABS
1.0000 mg | ORAL_TABLET | Freq: Every day | ORAL | 0 refills | Status: AC | PRN
  Filled 2022-08-01: qty 30, 30d supply, fill #0

## 2022-10-02 ENCOUNTER — Telehealth: Payer: No Typology Code available for payment source | Admitting: Physician Assistant

## 2022-10-02 DIAGNOSIS — R21 Rash and other nonspecific skin eruption: Secondary | ICD-10-CM | POA: Diagnosis not present

## 2022-10-02 MED ORDER — MUPIROCIN 2 % EX OINT: 1.0000 | TOPICAL_OINTMENT | Freq: Two times a day (BID) | CUTANEOUS | 0 refills | Status: AC

## 2022-10-02 NOTE — Progress Notes (Signed)
I have spent 5 minutes in review of e-visit questionnaire, review and updating patient chart, medical decision making and response to patient.   Shavette Shoaff Cody Kearia Yin, PA-C    

## 2022-10-02 NOTE — Progress Notes (Signed)
E Visit for Rash  We are sorry that you are not feeling well. Here is how we plan to help!  I agree that the pictures presented due raise the concern for the start of an impetigo rash, although sometimes other things can mimic this at the onset of symptoms. For now, keep the area clean and dry. Avoid scratching the area. I am giving you a topical antibiotic to apply 2 x daily for the next 7-10 days to resolve this. If anything worsening despite treatment, please let us know.   HOME CARE:  Take cool showers and avoid direct sunlight. Apply cool compress or wet dressings. Use hydrocortisone cream. Take an antihistamine like Benadryl for widespread rashes that itch.  The adult dose of Benadryl is 25-50 mg by mouth 4 times daily. Caution:  This type of medication may cause sleepiness.  Do not drink alcohol, drive, or operate dangerous machinery while taking antihistamines.  Do not take these medications if you have prostate enlargement.  Read package instructions thoroughly on all medications that you take.  GET HELP RIGHT AWAY IF:  Symptoms don't go away after treatment. Severe itching that persists. If you rash spreads or swells. If you rash begins to smell. If it blisters and opens or develops a yellow-brown crust. You develop a fever. You have a sore throat. You become short of breath.  MAKE SURE YOU:  Understand these instructions. Will watch your condition. Will get help right away if you are not doing well or get worse.  Thank you for choosing an e-visit.  Your e-visit answers were reviewed by a board certified advanced clinical practitioner to complete your personal care plan. Depending upon the condition, your plan could have included both over the counter or prescription medications.  Please review your pharmacy choice. Make sure the pharmacy is open so you can pick up prescription now. If there is a problem, you may contact your provider through [employer] and have  the prescription routed to another pharmacy.  Your safety is important to [place]. If you have drug allergies check your prescription carefully.   For the next 24 hours you can use MyChart to ask questions about today's visit, request a non-urgent call back, or ask for a work or school excuse. You will get an email in the next two days asking about your experience. I hope that your e-visit has been valuable and will speed your recovery.

## 2022-10-24 ENCOUNTER — Other Ambulatory Visit (HOSPITAL_COMMUNITY): Payer: Self-pay

## 2022-10-24 MED ORDER — FLUOXETINE HCL 20 MG PO CAPS
20.0000 mg | ORAL_CAPSULE | Freq: Every day | ORAL | 2 refills | Status: DC
  Filled 2022-10-24: qty 90, 90d supply, fill #0
  Filled 2023-02-27: qty 90, 90d supply, fill #1
  Filled 2023-07-02: qty 90, 90d supply, fill #2

## 2022-12-03 DIAGNOSIS — L723 Sebaceous cyst: Secondary | ICD-10-CM | POA: Diagnosis not present

## 2023-02-27 ENCOUNTER — Other Ambulatory Visit (HOSPITAL_COMMUNITY): Payer: Self-pay

## 2023-02-27 MED ORDER — CLONAZEPAM 1 MG PO TABS
1.0000 mg | ORAL_TABLET | Freq: Every day | ORAL | 0 refills | Status: AC
  Filled 2023-02-27: qty 30, 30d supply, fill #0

## 2023-03-05 ENCOUNTER — Other Ambulatory Visit (HOSPITAL_COMMUNITY): Payer: Self-pay

## 2023-03-05 DIAGNOSIS — D2262 Melanocytic nevi of left upper limb, including shoulder: Secondary | ICD-10-CM | POA: Diagnosis not present

## 2023-03-05 DIAGNOSIS — L720 Epidermal cyst: Secondary | ICD-10-CM | POA: Diagnosis not present

## 2023-03-05 DIAGNOSIS — D2330 Other benign neoplasm of skin of unspecified part of face: Secondary | ICD-10-CM | POA: Diagnosis not present

## 2023-03-05 DIAGNOSIS — Z86018 Personal history of other benign neoplasm: Secondary | ICD-10-CM | POA: Diagnosis not present

## 2023-03-05 DIAGNOSIS — D2261 Melanocytic nevi of right upper limb, including shoulder: Secondary | ICD-10-CM | POA: Diagnosis not present

## 2023-03-05 DIAGNOSIS — D225 Melanocytic nevi of trunk: Secondary | ICD-10-CM | POA: Diagnosis not present

## 2023-03-05 DIAGNOSIS — D2372 Other benign neoplasm of skin of left lower limb, including hip: Secondary | ICD-10-CM | POA: Diagnosis not present

## 2023-03-05 DIAGNOSIS — Z808 Family history of malignant neoplasm of other organs or systems: Secondary | ICD-10-CM | POA: Diagnosis not present

## 2023-03-05 DIAGNOSIS — D2361 Other benign neoplasm of skin of right upper limb, including shoulder: Secondary | ICD-10-CM | POA: Diagnosis not present

## 2023-03-05 DIAGNOSIS — L578 Other skin changes due to chronic exposure to nonionizing radiation: Secondary | ICD-10-CM | POA: Diagnosis not present

## 2023-03-05 MED ORDER — TRIAMCINOLONE ACETONIDE 0.025 % EX OINT
TOPICAL_OINTMENT | Freq: Two times a day (BID) | CUTANEOUS | 0 refills | Status: AC
  Filled 2023-03-05: qty 30, 15d supply, fill #0

## 2023-03-06 ENCOUNTER — Other Ambulatory Visit (HOSPITAL_COMMUNITY): Payer: Self-pay

## 2023-04-25 ENCOUNTER — Other Ambulatory Visit: Payer: Self-pay | Admitting: Oncology

## 2023-04-25 DIAGNOSIS — Z006 Encounter for examination for normal comparison and control in clinical research program: Secondary | ICD-10-CM

## 2023-05-05 ENCOUNTER — Other Ambulatory Visit
Admission: RE | Admit: 2023-05-05 | Discharge: 2023-05-05 | Disposition: A | Discharge status: Home / Self Care | Payer: 59 | Attending: Oncology | Admitting: Oncology

## 2023-05-05 DIAGNOSIS — Z006 Encounter for examination for normal comparison and control in clinical research program: Secondary | ICD-10-CM

## 2023-05-28 DIAGNOSIS — Z1231 Encounter for screening mammogram for malignant neoplasm of breast: Secondary | ICD-10-CM | POA: Diagnosis not present

## 2023-05-28 DIAGNOSIS — Z01419 Encounter for gynecological examination (general) (routine) without abnormal findings: Secondary | ICD-10-CM | POA: Diagnosis not present

## 2023-05-28 DIAGNOSIS — Z6828 Body mass index (BMI) 28.0-28.9, adult: Secondary | ICD-10-CM | POA: Diagnosis not present

## 2023-07-30 ENCOUNTER — Other Ambulatory Visit (HOSPITAL_COMMUNITY): Payer: Self-pay

## 2023-07-30 DIAGNOSIS — Z13220 Encounter for screening for lipoid disorders: Secondary | ICD-10-CM | POA: Diagnosis not present

## 2023-07-30 DIAGNOSIS — Z0000 Encounter for general adult medical examination without abnormal findings: Secondary | ICD-10-CM | POA: Diagnosis not present

## 2023-07-30 DIAGNOSIS — Z8639 Personal history of other endocrine, nutritional and metabolic disease: Secondary | ICD-10-CM | POA: Diagnosis not present

## 2023-07-30 DIAGNOSIS — Z6827 Body mass index (BMI) 27.0-27.9, adult: Secondary | ICD-10-CM | POA: Diagnosis not present

## 2023-07-30 DIAGNOSIS — F418 Other specified anxiety disorders: Secondary | ICD-10-CM | POA: Diagnosis not present

## 2023-07-30 MED ORDER — CLONAZEPAM 1 MG PO TABS
1.0000 mg | ORAL_TABLET | Freq: Every evening | ORAL | 0 refills | Status: AC | PRN
Start: 2023-07-30 — End: ?
  Filled 2023-07-30: qty 30, 30d supply, fill #0

## 2023-08-04 ENCOUNTER — Other Ambulatory Visit (HOSPITAL_COMMUNITY): Payer: Self-pay

## 2023-10-01 ENCOUNTER — Other Ambulatory Visit (HOSPITAL_COMMUNITY): Payer: Self-pay

## 2023-10-03 ENCOUNTER — Other Ambulatory Visit (HOSPITAL_COMMUNITY): Payer: Self-pay

## 2023-10-03 MED ORDER — FLUOXETINE HCL 20 MG PO CAPS
20.0000 mg | ORAL_CAPSULE | Freq: Every day | ORAL | 1 refills | Status: AC
  Filled 2023-10-03: qty 90, 90d supply, fill #0
  Filled 2024-01-08: qty 90, 90d supply, fill #1

## 2024-01-09 ENCOUNTER — Other Ambulatory Visit (HOSPITAL_COMMUNITY): Payer: Self-pay

## 2024-04-22 ENCOUNTER — Other Ambulatory Visit (HOSPITAL_COMMUNITY): Payer: Self-pay

## 2024-04-26 ENCOUNTER — Other Ambulatory Visit (HOSPITAL_COMMUNITY): Payer: Self-pay

## 2024-04-26 MED ORDER — CLONAZEPAM 1 MG PO TABS
1.0000 mg | ORAL_TABLET | Freq: Every evening | ORAL | 0 refills | Status: AC | PRN
Start: 2024-04-26 — End: ?
  Filled 2024-04-26: qty 30, 30d supply, fill #0

## 2024-04-27 ENCOUNTER — Other Ambulatory Visit (HOSPITAL_COMMUNITY): Payer: Self-pay

## 2024-04-27 ENCOUNTER — Encounter (HOSPITAL_COMMUNITY): Payer: Self-pay

## 2024-05-20 ENCOUNTER — Other Ambulatory Visit (HOSPITAL_COMMUNITY): Payer: Self-pay

## 2024-05-20 MED ORDER — FLUOXETINE HCL 20 MG PO CAPS
20.0000 mg | ORAL_CAPSULE | Freq: Every day | ORAL | 0 refills | Status: AC
  Filled 2024-05-20: qty 90, 90d supply, fill #0

## 2024-07-28 DIAGNOSIS — Z01419 Encounter for gynecological examination (general) (routine) without abnormal findings: Secondary | ICD-10-CM | POA: Diagnosis not present

## 2024-07-28 DIAGNOSIS — Z124 Encounter for screening for malignant neoplasm of cervix: Secondary | ICD-10-CM | POA: Diagnosis not present

## 2024-07-28 DIAGNOSIS — Z1231 Encounter for screening mammogram for malignant neoplasm of breast: Secondary | ICD-10-CM | POA: Diagnosis not present

## 2024-07-28 DIAGNOSIS — Z6832 Body mass index (BMI) 32.0-32.9, adult: Secondary | ICD-10-CM | POA: Diagnosis not present

## 2024-08-27 ENCOUNTER — Other Ambulatory Visit (HOSPITAL_BASED_OUTPATIENT_CLINIC_OR_DEPARTMENT_OTHER): Payer: Self-pay | Admitting: Family Medicine

## 2024-08-27 ENCOUNTER — Ambulatory Visit (HOSPITAL_BASED_OUTPATIENT_CLINIC_OR_DEPARTMENT_OTHER)
Admission: RE | Admit: 2024-08-27 | Discharge: 2024-08-27 | Disposition: A | Discharge status: Home / Self Care | Source: Ambulatory Visit | Attending: Family Medicine | Admitting: Family Medicine

## 2024-08-27 DIAGNOSIS — R52 Pain, unspecified: Secondary | ICD-10-CM | POA: Insufficient documentation

## 2024-08-27 DIAGNOSIS — M79661 Pain in right lower leg: Secondary | ICD-10-CM | POA: Diagnosis not present

## 2024-08-27 DIAGNOSIS — M79604 Pain in right leg: Secondary | ICD-10-CM | POA: Diagnosis not present

## 2024-09-01 ENCOUNTER — Other Ambulatory Visit (HOSPITAL_COMMUNITY): Payer: Self-pay

## 2024-09-01 MED ORDER — FLUOXETINE HCL 20 MG PO CAPS
20.0000 mg | ORAL_CAPSULE | Freq: Every day | ORAL | 0 refills | Status: DC
  Filled 2024-09-01: qty 90, 90d supply, fill #0

## 2024-09-02 ENCOUNTER — Other Ambulatory Visit (HOSPITAL_COMMUNITY): Payer: Self-pay

## 2024-09-02 MED ORDER — CLONAZEPAM 1 MG PO TABS
1.0000 mg | ORAL_TABLET | Freq: Every evening | ORAL | 0 refills | Status: AC | PRN
Start: 2024-09-02 — End: ?
  Filled 2024-09-02: qty 15, 30d supply, fill #0

## 2024-09-06 ENCOUNTER — Other Ambulatory Visit (HOSPITAL_COMMUNITY): Payer: Self-pay

## 2024-11-17 ENCOUNTER — Other Ambulatory Visit (HOSPITAL_COMMUNITY): Payer: Self-pay

## 2024-11-17 MED ORDER — FLUOXETINE HCL 20 MG PO CAPS
20.0000 mg | ORAL_CAPSULE | Freq: Every day | ORAL | 3 refills | Status: AC
  Filled 2024-11-19: qty 30, 30d supply, fill #0
  Filled 2024-11-19: qty 90, 90d supply, fill #0

## 2024-11-19 ENCOUNTER — Other Ambulatory Visit (HOSPITAL_COMMUNITY): Payer: Self-pay
